# Patient Record
Sex: Female | Born: 1975 | Race: White | Hispanic: No | Smoking: Former smoker
Health system: Southern US, Community
[De-identification: ages and names within clinical notes are randomized; demographics above are authoritative.]

## PROBLEM LIST (undated history)

## (undated) DIAGNOSIS — C099 Malignant neoplasm of tonsil, unspecified: Secondary | ICD-10-CM

## (undated) HISTORY — DX: Malignant neoplasm of tonsil, unspecified: C09.9

## (undated) HISTORY — PX: TUBAL LIGATION: SHX77

---

## 2007-01-19 ENCOUNTER — Emergency Department: Payer: Self-pay | Admitting: General Practice

## 2010-07-16 ENCOUNTER — Emergency Department: Payer: Self-pay | Admitting: Emergency Medicine

## 2011-06-09 ENCOUNTER — Emergency Department: Payer: Self-pay | Admitting: Emergency Medicine

## 2012-11-26 ENCOUNTER — Ambulatory Visit: Payer: Self-pay | Admitting: Oncology

## 2012-11-29 ENCOUNTER — Ambulatory Visit: Payer: Self-pay | Admitting: Oncology

## 2012-12-08 ENCOUNTER — Encounter: Payer: Self-pay | Admitting: Oncology

## 2012-12-13 LAB — COMPREHENSIVE METABOLIC PANEL
Albumin: 3.7 g/dL (ref 3.4–5.0)
Alkaline Phosphatase: 64 U/L (ref 50–136)
Bilirubin,Total: 0.3 mg/dL (ref 0.2–1.0)
Calcium, Total: 9 mg/dL (ref 8.5–10.1)
Chloride: 104 mmol/L (ref 98–107)
Co2: 28 mmol/L (ref 21–32)
EGFR (African American): >60
EGFR (Non-African Amer.): >60
Potassium: 3.8 mmol/L (ref 3.5–5.1)
SGOT(AST): 14 U/L — ABNORMAL LOW (ref 15–37)
SGPT (ALT): 23 U/L (ref 12–78)
Sodium: 141 mmol/L (ref 136–145)
Total Protein: 7.4 g/dL (ref 6.4–8.2)

## 2012-12-13 LAB — CBC CANCER CENTER
Basophil %: 0.6 %
Eosinophil %: 1.6 %
HGB: 14.2 g/dL (ref 12.0–16.0)
Lymphocyte #: 2.7 x10 3/mm (ref 1.0–3.6)
Lymphocyte %: 26.2 %
MCH: 30.9 pg (ref 26.0–34.0)
MCV: 89 fL (ref 80–100)
Monocyte %: 6.7 %
Neutrophil #: 6.6 x10 3/mm — ABNORMAL HIGH (ref 1.4–6.5)
Neutrophil %: 64.9 %
RDW: 12.7 % (ref 11.5–14.5)

## 2012-12-20 LAB — COMPREHENSIVE METABOLIC PANEL
Anion Gap: 7 (ref 7–16)
BUN: 11 mg/dL (ref 7–18)
Bilirubin,Total: 0.2 mg/dL (ref 0.2–1.0)
Chloride: 105 mmol/L (ref 98–107)
Creatinine: 0.66 mg/dL (ref 0.60–1.30)
EGFR (African American): >60
EGFR (Non-African Amer.): >60
Glucose: 86 mg/dL (ref 65–99)
Osmolality: 280 (ref 275–301)
Potassium: 3.9 mmol/L (ref 3.5–5.1)
SGOT(AST): 10 U/L — ABNORMAL LOW (ref 15–37)
SGPT (ALT): 22 U/L (ref 12–78)
Sodium: 141 mmol/L (ref 136–145)

## 2012-12-20 LAB — CBC CANCER CENTER
Basophil #: 0.1 x10 3/mm (ref 0.0–0.1)
Eosinophil #: 0.1 x10 3/mm (ref 0.0–0.7)
Eosinophil %: 1.1 %
HCT: 40.8 % (ref 35.0–47.0)
HGB: 13.7 g/dL (ref 12.0–16.0)
Lymphocyte #: 2.5 x10 3/mm (ref 1.0–3.6)
Lymphocyte %: 22 %
MCH: 30 pg (ref 26.0–34.0)
MCHC: 33.5 g/dL (ref 32.0–36.0)
MCV: 89 fL (ref 80–100)
Monocyte #: 0.5 x10 3/mm (ref 0.2–0.9)
Monocyte %: 4.8 %
Neutrophil #: 8.1 x10 3/mm — ABNORMAL HIGH (ref 1.4–6.5)
Platelet: 403 x10 3/mm (ref 150–440)
WBC: 11.4 x10 3/mm — ABNORMAL HIGH (ref 3.6–11.0)

## 2012-12-25 ENCOUNTER — Ambulatory Visit: Payer: Self-pay | Admitting: Oncology

## 2012-12-25 ENCOUNTER — Encounter: Payer: Self-pay | Admitting: Oncology

## 2012-12-27 LAB — CBC CANCER CENTER
Basophil #: 0 x10 3/mm (ref 0.0–0.1)
Eosinophil %: 1.5 %
HGB: 13.2 g/dL (ref 12.0–16.0)
Lymphocyte #: 1.7 x10 3/mm (ref 1.0–3.6)
Lymphocyte %: 18.1 %
MCH: 30.6 pg (ref 26.0–34.0)
Monocyte #: 0.5 x10 3/mm (ref 0.2–0.9)
Monocyte %: 5.3 %
Neutrophil %: 74.6 %
RBC: 4.3 10*6/uL (ref 3.80–5.20)
RDW: 12.8 % (ref 11.5–14.5)
WBC: 9.3 x10 3/mm (ref 3.6–11.0)

## 2012-12-27 LAB — COMPREHENSIVE METABOLIC PANEL
Albumin: 3.2 g/dL — ABNORMAL LOW (ref 3.4–5.0)
Anion Gap: 9 (ref 7–16)
BUN: 9 mg/dL (ref 7–18)
Bilirubin,Total: 0.4 mg/dL (ref 0.2–1.0)
Calcium, Total: 8.5 mg/dL (ref 8.5–10.1)
Chloride: 103 mmol/L (ref 98–107)
Co2: 28 mmol/L (ref 21–32)
Creatinine: 0.73 mg/dL (ref 0.60–1.30)
EGFR (Non-African Amer.): >60
Osmolality: 278 (ref 275–301)
Potassium: 3.8 mmol/L (ref 3.5–5.1)
SGOT(AST): 16 U/L (ref 15–37)
SGPT (ALT): 31 U/L (ref 12–78)
Sodium: 140 mmol/L (ref 136–145)
Total Protein: 6.6 g/dL (ref 6.4–8.2)

## 2013-01-03 LAB — CBC CANCER CENTER
Basophil %: 0.6 %
Eosinophil #: 0.1 x10 3/mm (ref 0.0–0.7)
HCT: 39.1 % (ref 35.0–47.0)
Lymphocyte #: 1.4 x10 3/mm (ref 1.0–3.6)
Lymphocyte %: 13.8 %
MCV: 91 fL (ref 80–100)
Monocyte %: 4 %
Neutrophil #: 8.1 x10 3/mm — ABNORMAL HIGH (ref 1.4–6.5)
Neutrophil %: 80.3 %
RDW: 13.5 % (ref 11.5–14.5)
WBC: 10.1 x10 3/mm (ref 3.6–11.0)

## 2013-01-03 LAB — COMPREHENSIVE METABOLIC PANEL
Albumin: 3.3 g/dL — ABNORMAL LOW (ref 3.4–5.0)
Alkaline Phosphatase: 56 U/L (ref 50–136)
Anion Gap: 5 — ABNORMAL LOW (ref 7–16)
BUN: 7 mg/dL (ref 7–18)
Bilirubin,Total: 0.4 mg/dL (ref 0.2–1.0)
Calcium, Total: 8.6 mg/dL (ref 8.5–10.1)
Co2: 31 mmol/L (ref 21–32)
EGFR (African American): >60
Glucose: 117 mg/dL — ABNORMAL HIGH (ref 65–99)
Potassium: 3.9 mmol/L (ref 3.5–5.1)
SGPT (ALT): 33 U/L (ref 12–78)
Sodium: 139 mmol/L (ref 136–145)

## 2013-01-10 LAB — COMPREHENSIVE METABOLIC PANEL
Albumin: 3.5 g/dL (ref 3.4–5.0)
Alkaline Phosphatase: 46 U/L — ABNORMAL LOW (ref 50–136)
Anion Gap: 11 (ref 7–16)
Bilirubin,Total: 0.3 mg/dL (ref 0.2–1.0)
Chloride: 101 mmol/L (ref 98–107)
Creatinine: 0.76 mg/dL (ref 0.60–1.30)
EGFR (African American): >60
EGFR (Non-African Amer.): >60
Glucose: 79 mg/dL (ref 65–99)
Osmolality: 277 (ref 275–301)
Potassium: 4 mmol/L (ref 3.5–5.1)
SGOT(AST): 17 U/L (ref 15–37)
SGPT (ALT): 29 U/L (ref 12–78)
Sodium: 140 mmol/L (ref 136–145)
Total Protein: 6.8 g/dL (ref 6.4–8.2)

## 2013-01-10 LAB — CBC CANCER CENTER
Basophil %: 1 %
Eosinophil #: 0.1 x10 3/mm (ref 0.0–0.7)
Lymphocyte #: 1.3 x10 3/mm (ref 1.0–3.6)
MCHC: 34.7 g/dL (ref 32.0–36.0)
MCV: 90 fL (ref 80–100)
Monocyte #: 0.6 x10 3/mm (ref 0.2–0.9)
Monocyte %: 6.8 %
Platelet: 275 x10 3/mm (ref 150–440)
RBC: 4.13 10*6/uL (ref 3.80–5.20)
WBC: 8.9 x10 3/mm (ref 3.6–11.0)

## 2013-01-17 LAB — COMPREHENSIVE METABOLIC PANEL
Albumin: 3.7 g/dL (ref 3.4–5.0)
Anion Gap: 2 — ABNORMAL LOW (ref 7–16)
Bilirubin,Total: 0.4 mg/dL (ref 0.2–1.0)
Calcium, Total: 8.9 mg/dL (ref 8.5–10.1)
Chloride: 109 mmol/L — ABNORMAL HIGH (ref 98–107)
Co2: 28 mmol/L (ref 21–32)
Creatinine: 0.77 mg/dL (ref 0.60–1.30)
EGFR (Non-African Amer.): >60
Potassium: 4.2 mmol/L (ref 3.5–5.1)
SGOT(AST): 44 U/L — ABNORMAL HIGH (ref 15–37)
SGPT (ALT): 77 U/L (ref 12–78)
Sodium: 139 mmol/L (ref 136–145)

## 2013-01-17 LAB — CBC CANCER CENTER
Basophil %: 0.5 %
HCT: 39.1 % (ref 35.0–47.0)
Lymphocyte #: 1 x10 3/mm (ref 1.0–3.6)
Lymphocyte %: 16.3 %
MCHC: 34.4 g/dL (ref 32.0–36.0)
MCV: 91 fL (ref 80–100)
Monocyte #: 0.5 x10 3/mm (ref 0.2–0.9)
Neutrophil #: 4.6 x10 3/mm (ref 1.4–6.5)
Platelet: 249 x10 3/mm (ref 150–440)
RBC: 4.28 10*6/uL (ref 3.80–5.20)
RDW: 14.5 % (ref 11.5–14.5)

## 2013-01-22 ENCOUNTER — Ambulatory Visit: Payer: Self-pay | Admitting: Oncology

## 2013-01-24 LAB — CBC CANCER CENTER
Basophil #: 0 x10 3/mm (ref 0.0–0.1)
Basophil %: 0.5 %
Eosinophil #: 0.1 x10 3/mm (ref 0.0–0.7)
HCT: 40.6 % (ref 35.0–47.0)
Lymphocyte #: 1.2 x10 3/mm (ref 1.0–3.6)
Lymphocyte %: 15 %
MCH: 31.2 pg (ref 26.0–34.0)
MCV: 91 fL (ref 80–100)
Neutrophil #: 6.4 x10 3/mm (ref 1.4–6.5)
RDW: 15.1 % — ABNORMAL HIGH (ref 11.5–14.5)

## 2013-01-24 LAB — COMPREHENSIVE METABOLIC PANEL
Anion Gap: 12 (ref 7–16)
Bilirubin,Total: 0.6 mg/dL (ref 0.2–1.0)
Calcium, Total: 9 mg/dL (ref 8.5–10.1)
Chloride: 100 mmol/L (ref 98–107)
Co2: 26 mmol/L (ref 21–32)
EGFR (African American): >60
EGFR (Non-African Amer.): >60
Glucose: 100 mg/dL — ABNORMAL HIGH (ref 65–99)
Osmolality: 274 (ref 275–301)
Potassium: 4.1 mmol/L (ref 3.5–5.1)
SGOT(AST): 50 U/L — ABNORMAL HIGH (ref 15–37)
Sodium: 138 mmol/L (ref 136–145)
Total Protein: 7.4 g/dL (ref 6.4–8.2)

## 2013-01-31 LAB — CBC CANCER CENTER
Basophil #: 0 x10 3/mm (ref 0.0–0.1)
HCT: 40.7 % (ref 35.0–47.0)
Lymphocyte #: 1 x10 3/mm (ref 1.0–3.6)
MCV: 92 fL (ref 80–100)
Monocyte #: 0.4 x10 3/mm (ref 0.2–0.9)
Monocyte %: 7.7 %
Neutrophil %: 71.8 %
Platelet: 218 x10 3/mm (ref 150–440)
RBC: 4.43 10*6/uL (ref 3.80–5.20)
WBC: 5.2 x10 3/mm (ref 3.6–11.0)

## 2013-01-31 LAB — COMPREHENSIVE METABOLIC PANEL
Anion Gap: 11 (ref 7–16)
BUN: 7 mg/dL (ref 7–18)
Calcium, Total: 9.1 mg/dL (ref 8.5–10.1)
Chloride: 101 mmol/L (ref 98–107)
EGFR (African American): >60
EGFR (Non-African Amer.): >60
Glucose: 91 mg/dL (ref 65–99)
Osmolality: 275 (ref 275–301)
Potassium: 3.9 mmol/L (ref 3.5–5.1)
Sodium: 139 mmol/L (ref 136–145)
Total Protein: 7.1 g/dL (ref 6.4–8.2)

## 2013-02-04 ENCOUNTER — Emergency Department: Payer: Self-pay | Admitting: Emergency Medicine

## 2013-02-05 LAB — COMPREHENSIVE METABOLIC PANEL
Albumin: 3.8 g/dL (ref 3.4–5.0)
Alkaline Phosphatase: 51 U/L (ref 50–136)
Calcium, Total: 9.3 mg/dL (ref 8.5–10.1)
Chloride: 100 mmol/L (ref 98–107)
Creatinine: 0.74 mg/dL (ref 0.60–1.30)
EGFR (African American): >60
Glucose: 77 mg/dL (ref 65–99)
Potassium: 3.5 mmol/L (ref 3.5–5.1)
SGOT(AST): 21 U/L (ref 15–37)
SGPT (ALT): 43 U/L (ref 12–78)
Sodium: 137 mmol/L (ref 136–145)

## 2013-02-05 LAB — CBC
HGB: 14 g/dL (ref 12.0–16.0)
MCH: 31.9 pg (ref 26.0–34.0)
MCHC: 34.6 g/dL (ref 32.0–36.0)
MCV: 92 fL (ref 80–100)
RBC: 4.39 10*6/uL (ref 3.80–5.20)
WBC: 6.7 10*3/uL (ref 3.6–11.0)

## 2013-02-07 LAB — CBC CANCER CENTER
Basophil #: 0 x10 3/mm (ref 0.0–0.1)
Basophil %: 0.6 %
Eosinophil #: 0 x10 3/mm (ref 0.0–0.7)
HGB: 14.4 g/dL (ref 12.0–16.0)
Lymphocyte #: 0.9 x10 3/mm — ABNORMAL LOW (ref 1.0–3.6)
Lymphocyte %: 15.9 %
MCH: 31.9 pg (ref 26.0–34.0)
MCHC: 34.5 g/dL (ref 32.0–36.0)
Monocyte #: 0.4 x10 3/mm (ref 0.2–0.9)
Monocyte %: 6.9 %
Neutrophil %: 75.8 %
RBC: 4.5 10*6/uL (ref 3.80–5.20)
RDW: 15.7 % — ABNORMAL HIGH (ref 11.5–14.5)
WBC: 6 x10 3/mm (ref 3.6–11.0)

## 2013-02-07 LAB — COMPREHENSIVE METABOLIC PANEL
Albumin: 3.8 g/dL (ref 3.4–5.0)
Alkaline Phosphatase: 56 U/L (ref 50–136)
BUN: 10 mg/dL (ref 7–18)
Bilirubin,Total: 0.7 mg/dL (ref 0.2–1.0)
Calcium, Total: 9 mg/dL (ref 8.5–10.1)
Chloride: 101 mmol/L (ref 98–107)
Co2: 19 mmol/L — ABNORMAL LOW (ref 21–32)
EGFR (Non-African Amer.): >60
Osmolality: 272 (ref 275–301)
SGPT (ALT): 39 U/L (ref 12–78)
Sodium: 137 mmol/L (ref 136–145)
Total Protein: 7.2 g/dL (ref 6.4–8.2)

## 2013-02-22 ENCOUNTER — Ambulatory Visit: Payer: Self-pay | Admitting: Oncology

## 2013-03-24 ENCOUNTER — Ambulatory Visit: Payer: Self-pay | Admitting: Oncology

## 2013-05-02 ENCOUNTER — Ambulatory Visit: Payer: Self-pay | Admitting: Oncology

## 2013-05-24 ENCOUNTER — Ambulatory Visit: Payer: Self-pay | Admitting: Oncology

## 2013-06-06 ENCOUNTER — Ambulatory Visit: Payer: Self-pay | Admitting: Radiation Oncology

## 2013-06-06 LAB — COMPREHENSIVE METABOLIC PANEL
Albumin: 3.3 g/dL — ABNORMAL LOW (ref 3.4–5.0)
Alkaline Phosphatase: 66 U/L (ref 50–136)
Anion Gap: 8 (ref 7–16)
BUN: 12 mg/dL (ref 7–18)
Bilirubin,Total: 0.3 mg/dL (ref 0.2–1.0)
Calcium, Total: 8.9 mg/dL (ref 8.5–10.1)
Chloride: 105 mmol/L (ref 98–107)
EGFR (African American): >60
EGFR (Non-African Amer.): >60
Glucose: 89 mg/dL (ref 65–99)
Osmolality: 280 (ref 275–301)
Potassium: 3.2 mmol/L — ABNORMAL LOW (ref 3.5–5.1)
SGOT(AST): 9 U/L — ABNORMAL LOW (ref 15–37)
SGPT (ALT): 20 U/L (ref 12–78)
Sodium: 141 mmol/L (ref 136–145)
Total Protein: 6.6 g/dL (ref 6.4–8.2)

## 2013-06-06 LAB — CBC CANCER CENTER
Basophil %: 0.1 %
Eosinophil #: 0 x10 3/mm (ref 0.0–0.7)
HCT: 40.3 % (ref 35.0–47.0)
HGB: 14 g/dL (ref 12.0–16.0)
MCH: 33.8 pg (ref 26.0–34.0)
MCHC: 34.7 g/dL (ref 32.0–36.0)
Neutrophil #: 10.8 x10 3/mm — ABNORMAL HIGH (ref 1.4–6.5)
Neutrophil %: 86.2 %
Platelet: 299 x10 3/mm (ref 150–440)
RBC: 4.14 10*6/uL (ref 3.80–5.20)

## 2013-06-24 ENCOUNTER — Ambulatory Visit: Payer: Self-pay | Admitting: Oncology

## 2013-09-13 ENCOUNTER — Ambulatory Visit: Payer: Self-pay | Admitting: Oncology

## 2013-09-13 LAB — CBC CANCER CENTER
Basophil %: 0.8 %
Eosinophil %: 1.6 %
HCT: 39.5 % (ref 35.0–47.0)
Lymphocyte %: 17.7 %
MCH: 32.4 pg (ref 26.0–34.0)
MCHC: 34.6 g/dL (ref 32.0–36.0)
Monocyte #: 0.3 x10 3/mm (ref 0.2–0.9)
Neutrophil #: 5.3 x10 3/mm (ref 1.4–6.5)
RBC: 4.22 10*6/uL (ref 3.80–5.20)
RDW: 12.6 % (ref 11.5–14.5)
WBC: 7 x10 3/mm (ref 3.6–11.0)

## 2013-09-13 LAB — BASIC METABOLIC PANEL
Anion Gap: 5 — ABNORMAL LOW (ref 7–16)
BUN: 8 mg/dL (ref 7–18)
Co2: 29 mmol/L (ref 21–32)
Creatinine: 0.77 mg/dL (ref 0.60–1.30)
EGFR (African American): >60
EGFR (Non-African Amer.): >60
Glucose: 85 mg/dL (ref 65–99)
Potassium: 3.9 mmol/L (ref 3.5–5.1)
Sodium: 141 mmol/L (ref 136–145)

## 2013-09-24 ENCOUNTER — Ambulatory Visit: Payer: Self-pay | Admitting: Oncology

## 2013-09-28 ENCOUNTER — Ambulatory Visit: Payer: Self-pay | Admitting: Unknown Physician Specialty

## 2013-10-06 ENCOUNTER — Ambulatory Visit: Payer: Self-pay | Admitting: Oncology

## 2013-10-24 ENCOUNTER — Ambulatory Visit: Payer: Self-pay | Admitting: Oncology

## 2013-11-03 LAB — CBC CANCER CENTER
Basophil %: 0.7 %
Eosinophil #: 0.1 x10 3/mm (ref 0.0–0.7)
HCT: 38.4 % (ref 35.0–47.0)
Lymphocyte %: 20 %
MCH: 31 pg (ref 26.0–34.0)
MCHC: 33.7 g/dL (ref 32.0–36.0)
MCV: 92 fL (ref 80–100)
Monocyte #: 0.3 x10 3/mm (ref 0.2–0.9)
Monocyte %: 5.8 %
Neutrophil %: 71.3 %
WBC: 4.7 x10 3/mm (ref 3.6–11.0)

## 2013-11-03 LAB — COMPREHENSIVE METABOLIC PANEL
Calcium, Total: 8.8 mg/dL (ref 8.5–10.1)
Chloride: 106 mmol/L (ref 98–107)
Co2: 29 mmol/L (ref 21–32)
EGFR (African American): >60
EGFR (Non-African Amer.): >60
Osmolality: 282 (ref 275–301)
Potassium: 3.8 mmol/L (ref 3.5–5.1)
SGPT (ALT): 15 U/L (ref 12–78)
Sodium: 142 mmol/L (ref 136–145)

## 2013-11-03 LAB — TSH: Thyroid Stimulating Horm: 6.12 u[IU]/mL — ABNORMAL HIGH

## 2013-11-07 ENCOUNTER — Ambulatory Visit: Payer: Self-pay | Admitting: Gastroenterology

## 2013-11-24 ENCOUNTER — Ambulatory Visit: Payer: Self-pay | Admitting: Oncology

## 2013-12-01 ENCOUNTER — Ambulatory Visit: Payer: Self-pay | Admitting: Oncology

## 2013-12-12 ENCOUNTER — Observation Stay: Payer: Self-pay | Admitting: Oncology

## 2013-12-12 LAB — CBC CANCER CENTER
BASOS ABS: 0 x10 3/mm (ref 0.0–0.1)
Basophil %: 0.5 %
EOS ABS: 0.1 x10 3/mm (ref 0.0–0.7)
Eosinophil %: 0.9 %
HCT: 41.7 % (ref 35.0–47.0)
HGB: 14.2 g/dL (ref 12.0–16.0)
Lymphocyte #: 1.2 x10 3/mm (ref 1.0–3.6)
Lymphocyte %: 18 %
MCH: 31 pg (ref 26.0–34.0)
MCHC: 34.1 g/dL (ref 32.0–36.0)
MCV: 91 fL (ref 80–100)
Monocyte #: 0.3 x10 3/mm (ref 0.2–0.9)
Monocyte %: 4.2 %
Neutrophil #: 4.9 x10 3/mm (ref 1.4–6.5)
Neutrophil %: 76.4 %
Platelet: 265 x10 3/mm (ref 150–440)
RBC: 4.59 10*6/uL (ref 3.80–5.20)
RDW: 13.1 % (ref 11.5–14.5)
WBC: 6.4 x10 3/mm (ref 3.6–11.0)

## 2013-12-12 LAB — URINALYSIS, COMPLETE
BILIRUBIN, UR: NEGATIVE
BLOOD: NEGATIVE
Glucose,UR: NEGATIVE mg/dL (ref 0–75)
Leukocyte Esterase: NEGATIVE
NITRITE: NEGATIVE
PROTEIN: NEGATIVE
Ph: 6 (ref 4.5–8.0)
RBC,UR: 1 /HPF (ref 0–5)
Specific Gravity: 1.027 (ref 1.003–1.030)
Squamous Epithelial: 6

## 2013-12-12 LAB — COMPREHENSIVE METABOLIC PANEL
ALBUMIN: 3.4 g/dL (ref 3.4–5.0)
ALK PHOS: 45 U/L
ALT: 13 U/L (ref 12–78)
Anion Gap: 8 (ref 7–16)
BILIRUBIN TOTAL: 0.3 mg/dL (ref 0.2–1.0)
BUN: 11 mg/dL (ref 7–18)
CALCIUM: 8.8 mg/dL (ref 8.5–10.1)
CO2: 24 mmol/L (ref 21–32)
Chloride: 107 mmol/L (ref 98–107)
Creatinine: 0.69 mg/dL (ref 0.60–1.30)
Glucose: 84 mg/dL (ref 65–99)
Osmolality: 276 (ref 275–301)
Potassium: 3.2 mmol/L — ABNORMAL LOW (ref 3.5–5.1)
SGOT(AST): 21 U/L (ref 15–37)
Sodium: 139 mmol/L (ref 136–145)
Total Protein: 6.9 g/dL (ref 6.4–8.2)

## 2013-12-12 LAB — T4, FREE: Free Thyroxine: 0.93 ng/dL (ref 0.76–1.46)

## 2013-12-12 LAB — TSH: THYROID STIMULATING HORM: 3.25 u[IU]/mL

## 2013-12-13 LAB — BASIC METABOLIC PANEL
Anion Gap: 7 (ref 7–16)
BUN: 9 mg/dL (ref 7–18)
CALCIUM: 8.6 mg/dL (ref 8.5–10.1)
CO2: 23 mmol/L (ref 21–32)
Chloride: 108 mmol/L — ABNORMAL HIGH (ref 98–107)
Creatinine: 0.75 mg/dL (ref 0.60–1.30)
GLUCOSE: 67 mg/dL (ref 65–99)
OSMOLALITY: 273 (ref 275–301)
POTASSIUM: 3.1 mmol/L — AB (ref 3.5–5.1)
SODIUM: 138 mmol/L (ref 136–145)

## 2013-12-17 LAB — CULTURE, BLOOD (SINGLE)

## 2013-12-25 ENCOUNTER — Ambulatory Visit: Payer: Self-pay | Admitting: Oncology

## 2014-01-22 ENCOUNTER — Ambulatory Visit: Payer: Self-pay | Admitting: Oncology

## 2014-02-23 ENCOUNTER — Ambulatory Visit: Payer: Self-pay | Admitting: Oncology

## 2014-02-24 LAB — BASIC METABOLIC PANEL
Anion Gap: 7 (ref 7–16)
BUN: 11 mg/dL (ref 7–18)
Calcium, Total: 9 mg/dL (ref 8.5–10.1)
Chloride: 103 mmol/L (ref 98–107)
Co2: 30 mmol/L (ref 21–32)
Creatinine: 0.8 mg/dL (ref 0.60–1.30)
GLUCOSE: 70 mg/dL (ref 65–99)
Osmolality: 277 (ref 275–301)
POTASSIUM: 3.5 mmol/L (ref 3.5–5.1)
Sodium: 140 mmol/L (ref 136–145)

## 2014-02-24 LAB — CBC CANCER CENTER
BASOS PCT: 0.7 %
Basophil #: 0.1 x10 3/mm (ref 0.0–0.1)
Eosinophil #: 0.1 x10 3/mm (ref 0.0–0.7)
Eosinophil %: 1.1 %
HCT: 38.5 % (ref 35.0–47.0)
HGB: 12.8 g/dL (ref 12.0–16.0)
Lymphocyte #: 1.6 x10 3/mm (ref 1.0–3.6)
Lymphocyte %: 21.4 %
MCH: 30.7 pg (ref 26.0–34.0)
MCHC: 33.3 g/dL (ref 32.0–36.0)
MCV: 92 fL (ref 80–100)
Monocyte #: 0.4 x10 3/mm (ref 0.2–0.9)
Monocyte %: 4.9 %
Neutrophil #: 5.3 x10 3/mm (ref 1.4–6.5)
Neutrophil %: 71.9 %
Platelet: 328 x10 3/mm (ref 150–440)
RBC: 4.17 10*6/uL (ref 3.80–5.20)
RDW: 13 % (ref 11.5–14.5)
WBC: 7.3 x10 3/mm (ref 3.6–11.0)

## 2014-02-24 LAB — MAGNESIUM: MAGNESIUM: 1.8 mg/dL

## 2014-02-24 LAB — TSH: THYROID STIMULATING HORM: 6.84 u[IU]/mL — AB

## 2014-03-24 ENCOUNTER — Ambulatory Visit: Payer: Self-pay | Admitting: Oncology

## 2014-06-02 ENCOUNTER — Ambulatory Visit: Payer: Self-pay | Admitting: Oncology

## 2014-06-02 LAB — TSH: Thyroid Stimulating Horm: 9 u[IU]/mL — ABNORMAL HIGH

## 2014-06-02 LAB — BASIC METABOLIC PANEL
Anion Gap: 6 — ABNORMAL LOW (ref 7–16)
BUN: 9 mg/dL (ref 7–18)
CALCIUM: 8.6 mg/dL (ref 8.5–10.1)
CHLORIDE: 107 mmol/L (ref 98–107)
Co2: 27 mmol/L (ref 21–32)
Creatinine: 0.72 mg/dL (ref 0.60–1.30)
EGFR (African American): >60
EGFR (Non-African Amer.): >60
Glucose: 79 mg/dL (ref 65–99)
Osmolality: 277 (ref 275–301)
Potassium: 3.8 mmol/L (ref 3.5–5.1)
SODIUM: 140 mmol/L (ref 136–145)

## 2014-06-24 ENCOUNTER — Ambulatory Visit: Payer: Self-pay | Admitting: Oncology

## 2014-10-05 ENCOUNTER — Ambulatory Visit: Payer: Self-pay | Admitting: Oncology

## 2014-12-25 ENCOUNTER — Ambulatory Visit: Payer: Self-pay | Admitting: Oncology

## 2014-12-25 LAB — BASIC METABOLIC PANEL
ANION GAP: 5 — AB (ref 7–16)
BUN: 11 mg/dL (ref 7–18)
CHLORIDE: 109 mmol/L — AB (ref 98–107)
Calcium, Total: 8.6 mg/dL (ref 8.5–10.1)
Co2: 25 mmol/L (ref 21–32)
Creatinine: 0.74 mg/dL (ref 0.60–1.30)
EGFR (Non-African Amer.): >60
Glucose: 104 mg/dL — ABNORMAL HIGH (ref 65–99)
Osmolality: 277 (ref 275–301)
Potassium: 3.9 mmol/L (ref 3.5–5.1)
Sodium: 139 mmol/L (ref 136–145)

## 2014-12-25 LAB — T4, FREE: FREE THYROXINE: 0.76 ng/dL (ref 0.76–1.46)

## 2014-12-25 LAB — TSH: Thyroid Stimulating Horm: 14.2 u[IU]/mL — ABNORMAL HIGH

## 2015-01-23 ENCOUNTER — Ambulatory Visit
Admit: 2015-01-23 | Disposition: A | Discharge status: Home / Self Care | Payer: Self-pay | Attending: Oncology | Admitting: Oncology

## 2015-03-05 ENCOUNTER — Emergency Department
Admit: 2015-03-05 | Disposition: A | Discharge status: Home / Self Care | Payer: Self-pay | Admitting: Emergency Medicine

## 2015-03-16 NOTE — Consult Note (Signed)
Reason for Visit: This 39 year old Female patient presents to the clinic for initial evaluation of  head and neck cancer .   Referred by Dr. Colin Benton [facility].  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 Initially was associated with strep throat although progressed in size. She initially had an FNA of her left tonsil which showed rare atypical squamous epithelium cannot exclude squamous cell carcinoma. She was evaluated by ENT at [facility]. She then underwent a left neck dissection with panendoscopy with left partial glossectomy, left neck dissection, left pharyngectomy, right tonsillectomy and nasopharyngoscopy. She also had a skin graft from her left shoulder. 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 She is having no head and neck pain or dysphagia at this time. Final pathology report showed poorly differentiated squamous cell carcinoma most likely arising from the left tonsil with invasion of the deep pharynx involving fibroadenosis adipose tissue and skeletal muscle. There was also perineural invasion. Right tonsil was negative for malignancy. Multiple lymph nodes were involved including a 4.8 cm node with extra nodal extension of metastatic disease. Level II and level III nodes were involved. Patient is now seen for consideration of postoperative radiation therapy and chemotherapy.  Past Hx:    Denies medical history:    Tubal  Ligation:   Past, Family and Social History:   Past Medical History positive    Past Surgical History tubal ligation    Family History noncontributory    Social History positive    Social History Comments greater than 30-pack-year smoking history has recently quit smoking.    Additional Past Medical and Surgical History patient is accompanied by today   Allergies:   Codeine: Hives, Other  Home Meds:  Home Medications: Medication Instructions Status  oxyCODONE 10 mg oral tablet 1 tab(s) orally every 6 hours as needed for pain. Active  MS Contin 15 mg/12 hr tablet, extended release 1 tab(s) orally every 8 hours x 30 days, As Needed Active   Review of Systems:   General negative    Performance Status (ECOG) 0    Skin negative    Breast negative    Ophthalmologic negative    ENMT see HPI    Respiratory and Thorax negative    Cardiovascular negative    Gastrointestinal negative    Genitourinary negative    Musculoskeletal negative    Neurological negative    Psychiatric negative    Hematology/Lymphatics negative    Endocrine negative    Allergic/Immunologic negative    Review of Systems   according to the nurse's notesPatient denies any weight loss, fatigue, weakness, fever, chills or night sweats. Patient denies any loss of vision, blurred vision. Patient denies any ringing  of the ears or hearing loss. No irregular heartbeat. Patient denies heart murmur or history of fainting. Patient denies any chest pain or pain radiating to her upper extremities. Patient denies any shortness of breath, difficulty breathing  at night, cough or hemoptysis. Patient denies any swelling in the lower legs. Patient denies any nausea vomiting, vomiting of blood, or coffee ground material in the vomitus. Patient denies any stomach pain. Patient states has had normal bowel movements no significant constipation or diarrhea. Patient denies any dysuria, hematuria or significant nocturia.  Patient denies any problems walking, swelling in the joints or loss of balance. Patient denies any skin changes, loss of hair or loss of weight. Patient denies any excessive worrying or anxiety or significant depression. Patient denies any problems with insomnia. Patient denies excessive thirst, polyuria, polydipsia. Patient denies any swollen glands, patient denies easy bruising or easy bleeding. Patient denies any recent infections, allergies or URI. Patient "s visual fields have not changed significantly in recent time.  Physical Exam:  General/Skin/HEENT:   General normal    Skin normal    Eyes normal    Additional PE well-developed female in NAD. Oral cavity shows teeth in a fair state of repair. She status post bilateral tonsillectomies. No oral mucosal lesions are identified. Indirect mirror examination shows upper airway clear vallecula and base of tongue within normal limits. There is protrusion of the tongue with deviation towards the left side. Neck incision is well-healed. No evidence of some digastric cervical or supraclavicular adenopathy is appreciated. Lungs are clear to A&P cardiac examination shows regular rate and rhythm.   Breasts/Resp/CV/GI/GU:   Respiratory and Thorax normal    Cardiovascular normal    Gastrointestinal normal    Genitourinary normal   MS/Neuro/Psych/Lymph:   Musculoskeletal normal    Neurological normal    Lymphatics normal   Assessment and Plan:  Impression:   pathologic stage IVA squamous cell carcinoma of the left tonsil status postradical head and neck surgery and 39 year old female  Plan:   at this time based on her poor prognostic features including multiple involved left neck nodes, extracapsular spread of metastatic disease to nodes, perineural invasion, and invasion of the skeletal muscles of the head and neck would offer adjuvant radiation therapy. I discussed the case personally with Dr. Grayland Ormond. He will also be offering concurrent  cisplatin-based chemotherapy in the adjuvant setting. Although the doses debatable I would like to use IMRT treatment planning and delivery to spare the oral cavity, parotid glands, spinal [id number] and deliver up to 6400 cGy to the area of primary tumor involvement.8 risks and benefits of treatment were reviewed with the patient including possibility of increased xerostomia, skin reaction, fatigue, and increased frequency of dental caries. We have ordered a PET/CT scan for determination of any other gross residual disease or head and neck region or possibility of metastatic disease elsewhere. Will review that and then start treatment planning. I've ordered a CT scan simulation in about a week and a half. Patient seems to [place] treatment plan well.  I would like to take this opportunity to thank you for allowing me to continue to participate in this patient's care.  CC Referral:   cc: Dr. Colin Benton [facility] head and neck,   Electronic Signatures: Armstead Peaks (MD)  (Signed 03-Jan-14 12:38)  Authored: HPI, Diagnosis, Past Hx, PFSH, Allergies, Home Meds, ROS, Physical Exam, Encounter Assessment and Plan, CC Referring Physician   Last Updated: 03-Jan-14 12:38 by Armstead Peaks (MD)

## 2015-03-17 NOTE — Consult Note (Signed)
Pt had EGD a month ago, mild inflammation per patient.  She needs to stay on powerful laxatives due to her pain medicine.  Pain better after bowel cleaned out.  Recommended increase calorie intake from liquids and semiliquids and to purchase a bathroom scale and weigh frequently.  See consult by Loren Racer, PA.  Electronic Signatures: Manya Silvas (MD)  (Signed on 20-Jan-15 17:21)  Authored  Last Updated: 20-Jan-15 17:21 by Manya Silvas (MD)

## 2015-03-17 NOTE — Consult Note (Signed)
PATIENT NAMEITZY, [name] MR#:  [id number] DATE OF BIRTH:  May 07, 1976  DATE OF CONSULTATION:  12/13/2013  REFERRING PHYSICIAN: Dr. Grayland [name]  CONSULTING PHYSICIAN:  Brittany Cheers, MD and Brittany Sox. Brittany Rowser, PA-C  REASON FOR CONSULTATION: Nausea and vomiting.   HISTORY OF PRESENT ILLNESS: This is a pleasant 39 year old female established with the [facility] for history of tonsillar cancer. She continues to be followed by them and does have ongoing complaints of dysphagia with unintentional weight loss that was quite profound over the past year. The patient states she has lost over 100 pounds over the past year and in more recent months it has been about 60 pounds. She admits to an extremely poor appetite with very little p.o. intake and there were some concerns that she was starting to have some failure to thrive. The nausea and vomiting had gotten real significant over the past few days, and she was admitted for further treatment. She has been obtaining IV fluids as well as IV antiemetics and overnight she has actually felt significantly better. She did tolerate a full regular breakfast this morning and did request lunch that she is currently waiting on. She is denying any current nausea or vomiting. She has had some pretty severe constipation recently and did need to be manually disimpacted last night. She also had another large bowel movement to follow and does feel that this was a large contributor as to why she is feeling better today. She denies any black or bloody stools. No fever or chills. No chest pain or shortness of breath. She denies any heartburn or indigestion, but does feel ongoing dysphagia. Of note, recent PET scan, CT of the neck and EGD were overall unrevealing in identifying and etiology to her nausea, vomiting, dysphagia, and poor p.o. intake. EGD was performed by Dr. Allen [name] on November 07, 2013 and it was notable for some gastritis, but was otherwise unrevealing and did show a normal  esophagus.   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   PAST SURGICAL HISTORY: Tubal ligation and surgery for tonsillar cancer.   FAMILY HISTORY: No known family history of GI malignancy, colon polyps or IBD.   SOCIAL HISTORY: The patient does report tobacco use. No alcohol or illicit drug use.   ALLERGIES: CODEINE.   REVIEW OF SYSTEMS: A 10 system review of systems was obtained on the patient. Pertinent positives are mentioned above and otherwise negative.   OBJECTIVE:  VITAL SIGNS: Blood pressure 106/64, heart rate 58, respirations 20, temperature 97.6, bedside pulse oximetry 98% on room air.  GENERAL: This is a pleasant 39 year old female resting quietly and comfortably in bed in no acute distress. Alert and oriented x 3.  HEAD: Atraumatic, normocephalic.  NECK: Supple. No lymphadenopathy noted.  HEENT: Sclerae anicteric. Mucous membranes moist.  LUNGS: Respirations are even and unlabored. Clear to auscultation bilateral anterior lung fields.  CARDIAC: Regular rate and rhythm. S1 and S2 noted.  ABDOMEN: Soft, nontender, nondistended. Normoactive bowel sounds noted in all 4 quadrants and perhaps somewhat hyperactive, though the patient has been given medications to help her bowels move, which may be the reason they are hyperactive. No signs of an acute abdomen. No masses, hernias or organomegaly appreciated.  RECTAL: Deferred.  PSYCHIATRIC: Appropriate mood and affect.  EXTREMITIES: Negative for lower extremity edema, 2+ pulses noted bilateral upper extremities.   LABORATORY DATA: Sodium 138, potassium 3.1, BUN 9, creatinine 0.75, glucose 67.   OTHER DATA: Recent PET scan, CT  of the neck and EGD were overall negative. EGD was performed by Dr. Allen [name] on November 07, 2013 notable for gastritis and duodenitis, but did reveal a normal esophagus.   ASSESSMENT:  1.  Nausea and vomiting.  2.  History of stage IV squamous cell carcinoma of the right tonsil  established with the [place]. Recent PET scan and CT of the neck were negative.  3.  Ongoing dysphagia and throat discomfort.  4.  Constipation.  5.  Failure to thrive with recent weight loss over the past year.   PLAN: I have discussed this patient's case in detail with Dr. Gaylyn [name]. who is involved in the development of the patient's plan of care. The patient is symptomatically improved today and did tolerate a full solid regular breakfast this morning and is anticipating lunch in just a little bit and states that her nausea and vomiting have entirely resolved. Certainly a large contributor to her poor p.o. intake and nausea could be severe constipation. She was manually disimpacted last night and has felt significantly better since that time. Therefore, we do a stress the importance of getting her on a regular bowel regimen, following a high fiber diet, getting adequate hydration and certainly using laxatives or stool softeners as necessary to avoid getting a fecal impaction. An additional etiology is that she is on a fentanyl patch and oxycodone on a fairly regular basis and these certainly have side effects of constipation as well. Therefore, continue symptomatic management with IV fluids and antiemetics as needed. Diet as tolerated. We will continue to monitor this patient and make further recommendations per clinical course. All questions were answered.   Thank you so much for this consultation and for allowing [place] to participate in the patient's plan of care.  ____________________________ Brittany Sox. Brittany Tadros, PA-C kme:aw D: 12/13/2013 13:22:25 ET T: 12/13/2013 14:00:32 ET JOB#: [id number]  cc: Brittany Sox. Brittany Bala, PA-C, <Dictator> [place] PA ELECTRONICALLY SIGNED 12/13/2013 14:28

## 2015-06-26 ENCOUNTER — Other Ambulatory Visit: Payer: Self-pay | Admitting: *Deleted

## 2015-06-26 DIAGNOSIS — C099 Malignant neoplasm of tonsil, unspecified: Secondary | ICD-10-CM

## 2015-06-26 DIAGNOSIS — C760 Malignant neoplasm of head, face and neck: Secondary | ICD-10-CM

## 2015-06-26 DIAGNOSIS — Z923 Personal history of irradiation: Secondary | ICD-10-CM

## 2015-06-27 ENCOUNTER — Inpatient Hospital Stay: Payer: Medicare Other | Admitting: Oncology

## 2015-06-27 ENCOUNTER — Inpatient Hospital Stay: Payer: Medicare Other | Attending: Oncology

## 2015-07-25 ENCOUNTER — Inpatient Hospital Stay: Payer: Medicare Other

## 2015-07-25 ENCOUNTER — Inpatient Hospital Stay: Payer: Medicare Other | Admitting: Oncology

## 2015-08-14 ENCOUNTER — Ambulatory Visit: Payer: Medicare Other | Admitting: Oncology

## 2015-08-14 ENCOUNTER — Other Ambulatory Visit: Payer: Medicare Other

## 2015-08-28 ENCOUNTER — Inpatient Hospital Stay: Payer: Medicare Other | Attending: Oncology

## 2015-08-28 ENCOUNTER — Encounter: Payer: Self-pay | Admitting: Oncology

## 2015-08-28 ENCOUNTER — Inpatient Hospital Stay (HOSPITAL_BASED_OUTPATIENT_CLINIC_OR_DEPARTMENT_OTHER): Payer: Medicare Other | Admitting: Oncology

## 2015-08-28 VITALS — Wt 197.1 lb

## 2015-08-28 DIAGNOSIS — F419 Anxiety disorder, unspecified: Secondary | ICD-10-CM | POA: Insufficient documentation

## 2015-08-28 DIAGNOSIS — F17210 Nicotine dependence, cigarettes, uncomplicated: Secondary | ICD-10-CM | POA: Insufficient documentation

## 2015-08-28 DIAGNOSIS — Z79899 Other long term (current) drug therapy: Secondary | ICD-10-CM

## 2015-08-28 DIAGNOSIS — C099 Malignant neoplasm of tonsil, unspecified: Secondary | ICD-10-CM

## 2015-08-28 DIAGNOSIS — Z923 Personal history of irradiation: Secondary | ICD-10-CM

## 2015-08-28 LAB — BASIC METABOLIC PANEL
ANION GAP: 3 — AB (ref 5–15)
BUN: 12 mg/dL (ref 6–20)
CO2: 26 mmol/L (ref 22–32)
CREATININE: 0.75 mg/dL (ref 0.44–1.00)
Calcium: 7.9 mg/dL — ABNORMAL LOW (ref 8.9–10.3)
Chloride: 107 mmol/L (ref 101–111)
GFR calc non Af Amer: >60 mL/min (ref >60)
Glucose, Bld: 101 mg/dL — ABNORMAL HIGH (ref 65–99)
Potassium: 3.7 mmol/L (ref 3.5–5.1)
SODIUM: 136 mmol/L (ref 135–145)

## 2015-08-28 LAB — T4, FREE: Free T4: 0.67 ng/dL (ref 0.61–1.12)

## 2015-08-28 NOTE — Progress Notes (Signed)
Patient still has pain with swallowing and is being treated by pain clinic.

## 2015-08-29 LAB — THYROID PANEL WITH TSH
FREE THYROXINE INDEX: 1.6 (ref 1.2–4.9)
T3 Uptake Ratio: 24 % (ref 24–39)
T4, Total: 6.5 ug/dL (ref 4.5–12.0)
TSH: 7.84 u[IU]/mL — ABNORMAL HIGH (ref 0.450–4.500)

## 2015-09-13 DIAGNOSIS — C099 Malignant neoplasm of tonsil, unspecified: Secondary | ICD-10-CM | POA: Insufficient documentation

## 2015-09-13 DIAGNOSIS — C140 Malignant neoplasm of pharynx, unspecified: Secondary | ICD-10-CM | POA: Diagnosis not present

## 2015-09-13 DIAGNOSIS — G893 Neoplasm related pain (acute) (chronic): Secondary | ICD-10-CM | POA: Diagnosis not present

## 2015-09-13 DIAGNOSIS — Z79891 Long term (current) use of opiate analgesic: Secondary | ICD-10-CM | POA: Diagnosis not present

## 2015-09-13 NOTE — Progress Notes (Signed)
[place]  Telephone:([phone]) [phone] [phone]) [phone]  ID: Brittany [name] OB: March 01, 1976  MR#: [id number]  DTO#:[id number]  Patient Care Team: Lorelee Market, MD as PCP - General (Family Medicine)  CHIEF COMPLAINT:  Chief Complaint  Patient presents with  . tonsil cancer    INTERVAL HISTORY: Patient returns to clinic today for routine 6 month evaluation.  She continues to have pain with swallowing, but this is better controlled after treatment in the pain clinic. She has a good appetite and denies any further weight loss.  She has no neurologic complaints. She denies any fevers. She denies any chest pain or shortness of breath.  She denies any nausea, vomiting, constipation, diarrhea.  She has no urinary complaints.  Patient offers no further specific complaints today.   REVIEW OF SYSTEMS:   Review of Systems  Constitutional: Negative for fever, weight loss and malaise/fatigue.  HENT:       Dysphasia.  Cardiovascular: Negative.   Gastrointestinal: Negative.   Musculoskeletal: Negative.   Neurological: Negative.  Negative for weakness.    As per HPI. Otherwise, a complete review of systems is negatve.  PAST MEDICAL HISTORY: Past Medical History  Diagnosis Date  . Tonsil cancer ([place])     PAST SURGICAL HISTORY: Past Surgical History  Procedure Laterality Date  . Tubal ligation      FAMILY HISTORY: Reviewed and unchanged. No reported history of malignancy or chronic disease.     ADVANCED DIRECTIVES:    HEALTH MAINTENANCE: Social History  Substance Use Topics  . Smoking status: Current Every Day Smoker  . Smokeless tobacco: Never Used  . Alcohol Use: No     Colonoscopy:  PAP:  Bone density:  Lipid panel:  Allergies  Allergen Reactions  . Codeine Hives    Current Outpatient Prescriptions  Medication Sig Dispense Refill  . gabapentin (NEURONTIN) 300 MG capsule     . Oxycodone HCl 10 MG TABS TAKE 1 TABLET BY MOUTH EVERY 6 TO 8HOURS  AND 1 AT BEDTIME AS NEEDED  0   No current facility-administered medications for this visit.    OBJECTIVE: There were no vitals filed for this visit.   There is no height on file to calculate BMI.    ECOG FS:0 - Asymptomatic  General: Well-developed, well-nourished, no acute distress. Eyes: Pink conjunctiva, anicteric sclera. HEENT: Normocephalic, moist mucous membranes, clear oropharnyx. No palpable lymphadenopathy. Lungs: Clear to auscultation bilaterally. Heart: Regular rate and rhythm. No rubs, murmurs, or gallops. Abdomen: Soft, nontender, nondistended. No organomegaly noted, normoactive bowel sounds. Musculoskeletal: No edema, cyanosis, or clubbing. Neuro: Alert, answering all questions appropriately. Cranial nerves grossly intact. Skin: No rashes or petechiae noted. Psych: Normal affect.   LAB RESULTS:  Lab Results  Component Value Date   NA 136 08/28/2015   K 3.7 08/28/2015   CL 107 08/28/2015   CO2 26 08/28/2015   GLUCOSE 101* 08/28/2015   BUN 12 08/28/2015   CREATININE 0.75 08/28/2015   CALCIUM 7.9* 08/28/2015   PROT 6.9 12/12/2013   ALBUMIN 3.4 12/12/2013   AST 21 12/12/2013   ALT 13 12/12/2013   ALKPHOS 45 12/12/2013   BILITOT 0.3 12/12/2013   GFRNONAA >60 08/28/2015   GFRAA >60 08/28/2015    Lab Results  Component Value Date   WBC 7.3 02/24/2014   NEUTROABS 5.3 02/24/2014   HGB 12.8 02/24/2014   HCT 38.5 02/24/2014   MCV 92 02/24/2014   PLT 328 02/24/2014     STUDIES: No results found.  ASSESSMENT: Stage IVa tonsillar squamous cell carcinoma  PLAN:    1.  Head and neck cancer: Patient completed concurrent chemotherapy with cisplatin and XRT in March 2014.  CT results completed on January 01, 2015 were reviewed independently and are essentially unchanged.  No further imaging is necessary at this time unless there is suspicion of recurrence. Patient has not been evaluated by ENT in several years despite recommendations. Thyroid studies are  essentially within normal limits. Return to clinic in 6 months for routine evaluation. 2.  Pain: Continue current treatment per pain clinic. 3.  Anxiety: Continue Xanax as needed.   Patient expressed understanding and was in agreement with this plan. She also understands that She can call clinic at any time with any questions, concerns, or complaints.   Tonsil cancer [facility])   Staging form: Pharynx - Oropharynx, AJCC 7th Edition     Clinical stage from 09/13/2015: Stage IVA (T4a, N2, M0) - Signed by Lloyd Huger, MD on 09/13/2015   Lloyd Huger, MD   09/13/2015 11:49 PM

## 2015-10-29 ENCOUNTER — Emergency Department: Payer: Medicare Other

## 2015-10-29 ENCOUNTER — Emergency Department
Admission: EM | Admit: 2015-10-29 | Discharge: 2015-10-29 | Disposition: A | Discharge status: Home / Self Care | Payer: Medicare Other | Attending: Emergency Medicine | Admitting: Emergency Medicine

## 2015-10-29 ENCOUNTER — Encounter: Payer: Self-pay | Admitting: Emergency Medicine

## 2015-10-29 DIAGNOSIS — M545 Low back pain, unspecified: Secondary | ICD-10-CM

## 2015-10-29 DIAGNOSIS — Z3202 Encounter for pregnancy test, result negative: Secondary | ICD-10-CM | POA: Insufficient documentation

## 2015-10-29 DIAGNOSIS — Z79899 Other long term (current) drug therapy: Secondary | ICD-10-CM | POA: Diagnosis not present

## 2015-10-29 DIAGNOSIS — R109 Unspecified abdominal pain: Secondary | ICD-10-CM | POA: Insufficient documentation

## 2015-10-29 DIAGNOSIS — Z87891 Personal history of nicotine dependence: Secondary | ICD-10-CM | POA: Diagnosis not present

## 2015-10-29 DIAGNOSIS — M5442 Lumbago with sciatica, left side: Secondary | ICD-10-CM | POA: Diagnosis not present

## 2015-10-29 LAB — URINALYSIS COMPLETE WITH MICROSCOPIC (ARMC ONLY)
BACTERIA UA: NONE SEEN
BILIRUBIN URINE: NEGATIVE
GLUCOSE, UA: NEGATIVE mg/dL
Ketones, ur: NEGATIVE mg/dL
LEUKOCYTES UA: NEGATIVE
NITRITE: NEGATIVE
Protein, ur: NEGATIVE mg/dL
SPECIFIC GRAVITY, URINE: 1.024 (ref 1.005–1.030)
pH: 6 (ref 5.0–8.0)

## 2015-10-29 LAB — POCT PREGNANCY, URINE: PREG TEST UR: NEGATIVE

## 2015-10-29 LAB — CBC
HEMATOCRIT: 38.3 % (ref 35.0–47.0)
Hemoglobin: 12.9 g/dL (ref 12.0–16.0)
MCH: 30.2 pg (ref 26.0–34.0)
MCHC: 33.6 g/dL (ref 32.0–36.0)
MCV: 89.8 fL (ref 80.0–100.0)
Platelets: 323 10*3/uL (ref 150–440)
RBC: 4.26 MIL/uL (ref 3.80–5.20)
RDW: 13.2 % (ref 11.5–14.5)
WBC: 7 10*3/uL (ref 3.6–11.0)

## 2015-10-29 LAB — COMPREHENSIVE METABOLIC PANEL
ALBUMIN: 3.8 g/dL (ref 3.5–5.0)
ALK PHOS: 48 U/L (ref 38–126)
ALT: 14 U/L (ref 14–54)
AST: 16 U/L (ref 15–41)
Anion gap: 4 — ABNORMAL LOW (ref 5–15)
BUN: 10 mg/dL (ref 6–20)
CALCIUM: 8.7 mg/dL — AB (ref 8.9–10.3)
CO2: 28 mmol/L (ref 22–32)
CREATININE: 0.69 mg/dL (ref 0.44–1.00)
Chloride: 107 mmol/L (ref 101–111)
GFR calc Af Amer: >60 mL/min (ref >60)
GFR calc non Af Amer: >60 mL/min (ref >60)
GLUCOSE: 90 mg/dL (ref 65–99)
Potassium: 3.9 mmol/L (ref 3.5–5.1)
Sodium: 139 mmol/L (ref 135–145)
Total Bilirubin: <0.1 mg/dL — ABNORMAL LOW (ref 0.3–1.2)
Total Protein: 6.6 g/dL (ref 6.5–8.1)

## 2015-10-29 MED ORDER — IBUPROFEN 200 MG PO TABS: 600.0000 mg | ORAL_TABLET | Freq: Four times a day (QID) | ORAL | Status: AC | PRN

## 2015-10-29 MED ORDER — KETOROLAC TROMETHAMINE 30 MG/ML IJ SOLN
INTRAMUSCULAR | Status: AC
  Administered 2015-10-29: 30 mg via INTRAMUSCULAR
  Filled 2015-10-29: qty 1

## 2015-10-29 MED ORDER — KETOROLAC TROMETHAMINE 30 MG/ML IJ SOLN
30.0000 mg | Freq: Once | INTRAMUSCULAR | Status: AC
  Administered 2015-10-29: 30 mg via INTRAMUSCULAR

## 2015-10-29 MED ORDER — CYCLOBENZAPRINE HCL 5 MG PO TABS: 5.0000 mg | ORAL_TABLET | Freq: Three times a day (TID) | ORAL | Status: AC | PRN

## 2015-10-29 NOTE — ED Notes (Signed)
Pt states left sided flank pain, denies any painful urination, states some nauea, states she started her period yesterday

## 2015-10-29 NOTE — ED Notes (Signed)
L flank pain began yesterday, increasing. Denies burning with urination

## 2015-10-29 NOTE — ED Provider Notes (Signed)
[facility] Emergency Department Provider Note  ____________________________________________   I have reviewed the triage vital signs and the nursing notes.   HISTORY  Chief Complaint Flank Pain    HPI Brittany [name] is a 39 y.o. female who is largely healthy, remote history of throat cancer [place] today complaining of left low back pain. Patient is on her menstrual period. She states she does have painful cramping. It sometimes goes to her back but this feels more severe. She states the pain is in her flank this time and ways it is not been before. She denies history of kidney stones. She has not had vomiting. The pain started fairly suddenly yesterday. No fever no chills. No dysuria no urinary frequency.Denies pregnancy and is in fact not pregnant. No incontinence of bowel or bladder or neurologic complaints  Past Medical History  Diagnosis Date  . Tonsil cancer [facility])     Patient Active Problem List   Diagnosis Date Noted  . Tonsil cancer ([place]) 09/13/2015    Past Surgical History  Procedure Laterality Date  . Tubal ligation      Current Outpatient Rx  Name  Route  Sig  Dispense  Refill  . gabapentin (NEURONTIN) 300 MG capsule               . Oxycodone HCl 10 MG TABS      TAKE 1 TABLET BY MOUTH EVERY 6 TO 8HOURS AND 1 AT BEDTIME AS NEEDED      0     Allergies Codeine  No family history on file.  Social History Social History  Substance Use Topics  . Smoking status: Former Research scientist (life sciences)  . Smokeless tobacco: Never Used  . Alcohol Use: No    Review of Systems Constitutional: No fever/chills Eyes: No visual changes. ENT: No sore throat. No stiff neck no neck pain Cardiovascular: Denies chest pain. Respiratory: Denies shortness of breath. Gastrointestinal:   no vomiting.  No diarrhea.  No constipation. Genitourinary: Negative for dysuria. Musculoskeletal: Negative lower extremity swelling Skin: Negative for rash. Neurological:  Negative for headaches, focal weakness or numbness. 10-point ROS otherwise negative.  ____________________________________________   PHYSICAL EXAM:  VITAL SIGNS: ED Triage Vitals  Enc Vitals Group     BP 10/29/15 1618 127/80 mmHg     Pulse Rate 10/29/15 1618 64     Resp 10/29/15 1618 18     Temp 10/29/15 1618 98.2 F (36.8 C)     Temp Source 10/29/15 1618 Oral     SpO2 10/29/15 1618 97 %     Weight 10/29/15 1618 180 lb (81.647 kg)     Height 10/29/15 1618 5\' 4"  (1.626 m)     Head Cir --      Peak Flow --      Pain Score 10/29/15 1620 9     Pain Loc --      Pain Edu? --      Excl. in [place]? --     Constitutional: Alert and oriented. Well appearing and in no acute distress. Eyes: Conjunctivae are normal. PERRL. EOMI. Head: Atraumatic. Nose: No congestion/rhinnorhea. Mouth/Throat: Mucous membranes are moist.  Oropharynx non-erythematous. Neck: No stridor.   Nontender with no meningismus Cardiovascular: Normal rate, regular rhythm. Grossly normal heart sounds.  Good peripheral circulation. Respiratory: Normal respiratory effort.  No retractions. Lungs CTAB. Abdominal: Soft and nontender. No distention. No guarding no rebound Back:  There is tenderness to palpation of the left CVA region but also in the left  lower paraspinal region of the lumbar back, there is no midline tenderness. Musculoskeletal: No lower extremity tenderness. No joint effusions, no DVT signs strong distal pulses no edema Neurologic:  Normal speech and language. No gross focal neurologic deficits are appreciated.  Skin:  Skin is warm, dry and intact. No rash noted. Psychiatric: Mood and affect are normal. Speech and behavior are normal.  ____________________________________________   LABS (all labs ordered are listed, but only abnormal results are displayed)  Labs Reviewed  URINALYSIS COMPLETEWITH MICROSCOPIC ([place]) - Abnormal; Notable for the following:    Color, Urine YELLOW (*)    APPearance  CLEAR (*)    Hgb urine dipstick 3+ (*)    Squamous Epithelial / LPF 0-5 (*)    All other components within normal limits  COMPREHENSIVE METABOLIC PANEL - Abnormal; Notable for the following:    Calcium 8.7 (*)    Total Bilirubin <0.1 (*)    Anion gap 4 (*)    All other components within normal limits  CBC  POC URINE PREG, ED  POCT PREGNANCY, URINE   ____________________________________________  EKG  I personally interpreted any EKGs ordered by me or triage  ____________________________________________  RADIOLOGY  I reviewed any imaging ordered by me or triage that were performed during my shift ____________________________________________   PROCEDURES  Procedure(s) performed: None  Critical Care performed: None  ____________________________________________   INITIAL IMPRESSION / ASSESSMENT AND PLAN / ED COURSE  Pertinent labs & imaging results that were available during my care of the patient were reviewed by me and considered in my medical decision making (see chart for details).  We did do a CT scan given question of flank pain with questionable hematuria, this is negative. Patient has a history of back pain and her pain is not specific to the flank and does incorporate the muscles of the back as well. So the muscle skeletal etiology is possible. Serial abdominal exams are completely benign, there is no evidence of radiating pain from abdominal origin. At this time, most likely musculoskeletal pain. We'll send a urine culture although no evidence of urinary tract infection clinically or otherwise. There is hematuria the patient is on her menstrual period and would prefer not to the cathetered. ____________________________________________   FINAL CLINICAL IMPRESSION(S) / ED DIAGNOSES  Final diagnoses:  Flank pain     Schuyler Amor, MD 10/29/15 1806

## 2015-10-31 LAB — URINE CULTURE

## 2015-12-20 DIAGNOSIS — Z79891 Long term (current) use of opiate analgesic: Secondary | ICD-10-CM | POA: Diagnosis not present

## 2015-12-20 DIAGNOSIS — G894 Chronic pain syndrome: Secondary | ICD-10-CM | POA: Diagnosis not present

## 2015-12-20 DIAGNOSIS — G893 Neoplasm related pain (acute) (chronic): Secondary | ICD-10-CM | POA: Diagnosis not present

## 2015-12-20 DIAGNOSIS — C140 Malignant neoplasm of pharynx, unspecified: Secondary | ICD-10-CM | POA: Diagnosis not present

## 2016-02-26 ENCOUNTER — Inpatient Hospital Stay: Payer: Medicare Other

## 2016-02-26 ENCOUNTER — Inpatient Hospital Stay: Payer: Medicare Other | Admitting: Oncology

## 2016-03-13 ENCOUNTER — Inpatient Hospital Stay: Payer: Medicare Other | Attending: Oncology

## 2016-03-13 ENCOUNTER — Inpatient Hospital Stay (HOSPITAL_BASED_OUTPATIENT_CLINIC_OR_DEPARTMENT_OTHER): Payer: Medicare Other | Admitting: Oncology

## 2016-03-13 VITALS — BP 116/77 | HR 50 | Temp 98.2°F | Resp 16 | Wt 177.2 lb

## 2016-03-13 DIAGNOSIS — Z87891 Personal history of nicotine dependence: Secondary | ICD-10-CM | POA: Insufficient documentation

## 2016-03-13 DIAGNOSIS — M2550 Pain in unspecified joint: Secondary | ICD-10-CM | POA: Insufficient documentation

## 2016-03-13 DIAGNOSIS — Z923 Personal history of irradiation: Secondary | ICD-10-CM | POA: Insufficient documentation

## 2016-03-13 DIAGNOSIS — Z79899 Other long term (current) drug therapy: Secondary | ICD-10-CM | POA: Diagnosis not present

## 2016-03-13 DIAGNOSIS — R1310 Dysphagia, unspecified: Secondary | ICD-10-CM | POA: Insufficient documentation

## 2016-03-13 DIAGNOSIS — R531 Weakness: Secondary | ICD-10-CM | POA: Diagnosis not present

## 2016-03-13 DIAGNOSIS — R5383 Other fatigue: Secondary | ICD-10-CM | POA: Insufficient documentation

## 2016-03-13 DIAGNOSIS — F419 Anxiety disorder, unspecified: Secondary | ICD-10-CM | POA: Insufficient documentation

## 2016-03-13 DIAGNOSIS — Z9221 Personal history of antineoplastic chemotherapy: Secondary | ICD-10-CM

## 2016-03-13 DIAGNOSIS — C099 Malignant neoplasm of tonsil, unspecified: Secondary | ICD-10-CM

## 2016-03-13 LAB — CBC WITH DIFFERENTIAL/PLATELET
BASOS ABS: 0 10*3/uL (ref 0–0.1)
BASOS PCT: 1 %
Eosinophils Absolute: 0.1 10*3/uL (ref 0–0.7)
Eosinophils Relative: 1 %
HEMATOCRIT: 37.5 % (ref 35.0–47.0)
HEMOGLOBIN: 12.8 g/dL (ref 12.0–16.0)
Lymphocytes Relative: 20 %
Lymphs Abs: 1.7 10*3/uL (ref 1.0–3.6)
MCH: 30.2 pg (ref 26.0–34.0)
MCHC: 34.2 g/dL (ref 32.0–36.0)
MCV: 88.3 fL (ref 80.0–100.0)
MONOS PCT: 5 %
Monocytes Absolute: 0.4 10*3/uL (ref 0.2–0.9)
NEUTROS ABS: 6.2 10*3/uL (ref 1.4–6.5)
NEUTROS PCT: 73 %
Platelets: 302 10*3/uL (ref 150–440)
RBC: 4.25 MIL/uL (ref 3.80–5.20)
RDW: 13.2 % (ref 11.5–14.5)
WBC: 8.4 10*3/uL (ref 3.6–11.0)

## 2016-03-13 LAB — BASIC METABOLIC PANEL
ANION GAP: 2 — AB (ref 5–15)
BUN: 11 mg/dL (ref 6–20)
CHLORIDE: 108 mmol/L (ref 101–111)
CO2: 27 mmol/L (ref 22–32)
Calcium: 8.6 mg/dL — ABNORMAL LOW (ref 8.9–10.3)
Creatinine, Ser: 0.71 mg/dL (ref 0.44–1.00)
GFR calc non Af Amer: >60 mL/min (ref >60)
Glucose, Bld: 100 mg/dL — ABNORMAL HIGH (ref 65–99)
POTASSIUM: 4.1 mmol/L (ref 3.5–5.1)
SODIUM: 137 mmol/L (ref 135–145)

## 2016-03-13 LAB — TSH: TSH: 6 u[IU]/mL — ABNORMAL HIGH (ref 0.350–4.500)

## 2016-03-13 NOTE — Progress Notes (Signed)
[place]  Telephone:(336) [phone] [phone]) [phone]  ID: Brittany [name] OB: 1976/07/25  MR#: [id number]  [id number]  Patient Care Team: Lorelee Market, MD as PCP - General (Family Medicine)  CHIEF COMPLAINT:  Chief Complaint  Patient presents with  . tonsil cancer    INTERVAL HISTORY: Patient returns to clinic today for routine 6 month evaluation.  She continues to have pain with swallowing that is unchanged from previous visits. She also complains of joint pain and fatigue today. She has a good appetite and denies any further weight loss.  She has no neurologic complaints. She denies any fevers. She denies any chest pain or shortness of breath.  She denies any nausea, vomiting, constipation, diarrhea.  She has no urinary complaints.  Patient offers no further specific complaints today.   REVIEW OF SYSTEMS:   Review of Systems  Constitutional: Positive for malaise/fatigue. Negative for fever and weight loss.  HENT:       Dysphasia.  Cardiovascular: Negative.   Gastrointestinal: Negative.   Musculoskeletal: Positive for joint pain.  Neurological: Positive for weakness.    As per HPI. Otherwise, a complete review of systems is negatve.  PAST MEDICAL HISTORY: Past Medical History  Diagnosis Date  . Tonsil cancer ([place])     PAST SURGICAL HISTORY: Past Surgical History  Procedure Laterality Date  . Tubal ligation      FAMILY HISTORY: Reviewed and unchanged. No reported history of malignancy or chronic disease.     ADVANCED DIRECTIVES:    HEALTH MAINTENANCE: Social History  Substance Use Topics  . Smoking status: Former Research scientist (life sciences)  . Smokeless tobacco: Never Used  . Alcohol Use: No    Allergies  Allergen Reactions  . Codeine Hives    Current Outpatient Prescriptions  Medication Sig Dispense Refill  . cyclobenzaprine (FLEXERIL) 5 MG tablet Take 1 tablet (5 mg total) by mouth every 8 (eight) hours as needed for muscle spasms. 30 tablet  1  . gabapentin (NEURONTIN) 300 MG capsule     . ibuprofen (MOTRIN IB) 200 MG tablet Take 3 tablets (600 mg total) by mouth every 6 (six) hours as needed. 20 tablet 0  . Oxycodone HCl 10 MG TABS TAKE 1 TABLET BY MOUTH EVERY 6 TO 8HOURS AND 1 AT BEDTIME AS NEEDED  0   No current facility-administered medications for this visit.    OBJECTIVE: Filed Vitals:   03/13/16 1033  BP: 116/77  Pulse: 50  Temp: 98.2 F (36.8 C)  Resp: 16     Body mass index is 30.41 kg/(m^2).    ECOG FS:0 - Asymptomatic  General: Well-developed, well-nourished, no acute distress. Eyes: Pink conjunctiva, anicteric sclera. HEENT: Normocephalic, moist mucous membranes, clear oropharnyx. No palpable lymphadenopathy. Lungs: Clear to auscultation bilaterally. Heart: Regular rate and rhythm. No rubs, murmurs, or gallops. Abdomen: Soft, nontender, nondistended. No organomegaly noted, normoactive bowel sounds. Musculoskeletal: No edema, cyanosis, or clubbing. Neuro: Alert, answering all questions appropriately. Cranial nerves grossly intact. Skin: No rashes or petechiae noted. Psych: Normal affect.   LAB RESULTS:  Lab Results  Component Value Date   NA 137 03/13/2016   K 4.1 03/13/2016   CL 108 03/13/2016   CO2 27 03/13/2016   GLUCOSE 100* 03/13/2016   BUN 11 03/13/2016   CREATININE 0.71 03/13/2016   CALCIUM 8.6* 03/13/2016   PROT 6.6 10/29/2015   ALBUMIN 3.8 10/29/2015   AST 16 10/29/2015   ALT 14 10/29/2015   ALKPHOS 48 10/29/2015   BILITOT <0.1* 10/29/2015  GFRNONAA >60 03/13/2016   GFRAA >60 03/13/2016    Lab Results  Component Value Date   WBC 8.4 03/13/2016   NEUTROABS 6.2 03/13/2016   HGB 12.8 03/13/2016   HCT 37.5 03/13/2016   MCV 88.3 03/13/2016   PLT 302 03/13/2016     STUDIES: No results found.  ASSESSMENT: Stage IVa tonsillar squamous cell carcinoma  PLAN:    1.  Head and neck cancer: Patient completed concurrent chemotherapy with cisplatin and XRT in March 2014.  CT  results completed on January 01, 2015 were essentially unchanged.  No further imaging is necessary at this time unless there is suspicion of recurrence. Patient has not been evaluated by ENT in several years despite recommendations, will send referral to ENT today . Thyroid studies are pending for today. Return to clinic in 6 months for routine evaluation. 2.  Pain: Continue current treatment per pain clinic. 3.  Anxiety: Continue Xanax as needed.   Patient expressed understanding and was in agreement with this plan. She also understands that She can call clinic at any time with any questions, concerns, or complaints.   Tonsil cancer [facility])   Staging form: Pharynx - Oropharynx, AJCC 7th Edition     Clinical stage from 09/13/2015: Stage IVA (T4a, N2, M0) - Signed by Lloyd Huger, MD on 09/13/2015   Mayra Reel, NP   03/13/2016 11:06 AM  Patient was seen and evaluated independently and I agree with the assessment and plan as dictated above.  Lloyd Huger, MD 03/16/2016 8:13 PM

## 2016-03-13 NOTE — Progress Notes (Signed)
Patient has been feeling very fatigued and has achy joints.  The pain in throat has not seemed to improved and is being treated at pain clinic.

## 2016-03-14 LAB — T4: T4 TOTAL: 6.6 ug/dL (ref 4.5–12.0)

## 2016-04-10 DIAGNOSIS — G893 Neoplasm related pain (acute) (chronic): Secondary | ICD-10-CM | POA: Diagnosis not present

## 2016-04-10 DIAGNOSIS — Z79891 Long term (current) use of opiate analgesic: Secondary | ICD-10-CM | POA: Diagnosis not present

## 2016-04-10 DIAGNOSIS — C140 Malignant neoplasm of pharynx, unspecified: Secondary | ICD-10-CM | POA: Diagnosis not present

## 2016-04-10 DIAGNOSIS — Z5181 Encounter for therapeutic drug level monitoring: Secondary | ICD-10-CM | POA: Diagnosis not present

## 2016-07-02 DIAGNOSIS — C140 Malignant neoplasm of pharynx, unspecified: Secondary | ICD-10-CM | POA: Diagnosis not present

## 2016-07-02 DIAGNOSIS — Z5181 Encounter for therapeutic drug level monitoring: Secondary | ICD-10-CM | POA: Diagnosis not present

## 2016-07-02 DIAGNOSIS — G893 Neoplasm related pain (acute) (chronic): Secondary | ICD-10-CM | POA: Diagnosis not present

## 2016-07-02 DIAGNOSIS — Z79891 Long term (current) use of opiate analgesic: Secondary | ICD-10-CM | POA: Diagnosis not present

## 2016-09-03 DIAGNOSIS — G893 Neoplasm related pain (acute) (chronic): Secondary | ICD-10-CM | POA: Diagnosis not present

## 2016-09-03 DIAGNOSIS — G894 Chronic pain syndrome: Secondary | ICD-10-CM | POA: Diagnosis not present

## 2016-09-03 DIAGNOSIS — C140 Malignant neoplasm of pharynx, unspecified: Secondary | ICD-10-CM | POA: Diagnosis not present

## 2016-09-03 DIAGNOSIS — Z79891 Long term (current) use of opiate analgesic: Secondary | ICD-10-CM | POA: Diagnosis not present

## 2016-09-03 DIAGNOSIS — Z5181 Encounter for therapeutic drug level monitoring: Secondary | ICD-10-CM | POA: Diagnosis not present

## 2016-09-10 NOTE — Progress Notes (Deleted)
[place]  Telephone:(336) [phone] [phone]) [phone]  ID: Allene Dillon OB: [birth year]  MR#: [id number]  [id number]  Patient Care Team: Lorelee Market, MD as PCP - General (Family Medicine)  CHIEF COMPLAINT:  No chief complaint on file.   INTERVAL HISTORY: Patient returns to clinic today for routine 6 month evaluation.  She continues to have pain with swallowing that is unchanged from previous visits. She also complains of joint pain and fatigue today. She has a good appetite and denies any further weight loss.  She has no neurologic complaints. She denies any fevers. She denies any chest pain or shortness of breath.  She denies any nausea, vomiting, constipation, diarrhea.  She has no urinary complaints.  Patient offers no further specific complaints today.   REVIEW OF SYSTEMS:   Review of Systems  Constitutional: Positive for malaise/fatigue. Negative for fever and weight loss.  HENT:       Dysphasia.  Cardiovascular: Negative.   Gastrointestinal: Negative.   Musculoskeletal: Positive for joint pain.  Neurological: Positive for weakness.    As per HPI. Otherwise, a complete review of systems is negatve.  PAST MEDICAL HISTORY: Past Medical History:  Diagnosis Date  . Tonsil cancer ([place])     PAST SURGICAL HISTORY: Past Surgical History:  Procedure Laterality Date  . TUBAL LIGATION      FAMILY HISTORY: Reviewed and unchanged. No reported history of malignancy or chronic disease.     ADVANCED DIRECTIVES:    HEALTH MAINTENANCE: Social History  Substance Use Topics  . Smoking status: Former Research scientist (life sciences)  . Smokeless tobacco: Never Used  . Alcohol use No    Allergies  Allergen Reactions  . Codeine Hives    Current Outpatient Prescriptions  Medication Sig Dispense Refill  . cyclobenzaprine (FLEXERIL) 5 MG tablet Take 1 tablet (5 mg total) by mouth every 8 (eight) hours as needed for muscle spasms. 30 tablet 1  . gabapentin (NEURONTIN)  300 MG capsule     . ibuprofen (MOTRIN IB) 200 MG tablet Take 3 tablets (600 mg total) by mouth every 6 (six) hours as needed. 20 tablet 0  . Oxycodone HCl 10 MG TABS TAKE 1 TABLET BY MOUTH EVERY 6 TO 8HOURS AND 1 AT BEDTIME AS NEEDED  0   No current facility-administered medications for this visit.     OBJECTIVE: There were no vitals filed for this visit.   There is no height or weight on file to calculate BMI.    ECOG FS:0 - Asymptomatic  General: Well-developed, well-nourished, no acute distress. Eyes: Pink conjunctiva, anicteric sclera. HEENT: Normocephalic, moist mucous membranes, clear oropharnyx. No palpable lymphadenopathy. Lungs: Clear to auscultation bilaterally. Heart: Regular rate and rhythm. No rubs, murmurs, or gallops. Abdomen: Soft, nontender, nondistended. No organomegaly noted, normoactive bowel sounds. Musculoskeletal: No edema, cyanosis, or clubbing. Neuro: Alert, answering all questions appropriately. Cranial nerves grossly intact. Skin: No rashes or petechiae noted. Psych: Normal affect.   LAB RESULTS:  Lab Results  Component Value Date   NA 137 03/13/2016   K 4.1 03/13/2016   CL 108 03/13/2016   CO2 27 03/13/2016   GLUCOSE 100 (H) 03/13/2016   BUN 11 03/13/2016   CREATININE 0.71 03/13/2016   CALCIUM 8.6 (L) 03/13/2016   PROT 6.6 10/29/2015   ALBUMIN 3.8 10/29/2015   AST 16 10/29/2015   ALT 14 10/29/2015   ALKPHOS 48 10/29/2015   BILITOT <0.1 (L) 10/29/2015   GFRNONAA >60 03/13/2016   GFRAA >60 03/13/2016  Lab Results  Component Value Date   WBC 8.4 03/13/2016   NEUTROABS 6.2 03/13/2016   HGB 12.8 03/13/2016   HCT 37.5 03/13/2016   MCV 88.3 03/13/2016   PLT 302 03/13/2016     STUDIES: No results found.  ASSESSMENT: Stage IVa tonsillar squamous cell carcinoma  PLAN:    1.  Head and neck cancer: Patient completed concurrent chemotherapy with cisplatin and XRT in March 2014.  CT results completed on January 01, 2015 were essentially  unchanged.  No further imaging is necessary at this time unless there is suspicion of recurrence. Patient has not been evaluated by ENT in several years despite recommendations, will send referral to ENT today . Thyroid studies are pending for today. Return to clinic in 6 months for routine evaluation. 2.  Pain: Continue current treatment per pain clinic. 3.  Anxiety: Continue Xanax as needed.   Patient expressed understanding and was in agreement with this plan. She also understands that She can call clinic at any time with any questions, concerns, or complaints.   Tonsil cancer [facility])   Staging form: Pharynx - Oropharynx, AJCC 7th Edition     Clinical stage from 09/13/2015: Stage IVA (T4a, N2, M0) - Signed by Lloyd Huger, MD on 09/13/2015   Lloyd Huger, MD   09/10/2016 10:46 PM  Patient was seen and evaluated independently and I agree with the assessment and plan as dictated above.  Lloyd Huger, MD 09/10/16 10:46 PM

## 2016-09-11 ENCOUNTER — Inpatient Hospital Stay: Payer: Medicare Other | Admitting: Oncology

## 2016-09-11 ENCOUNTER — Inpatient Hospital Stay: Payer: Medicare Other

## 2016-09-24 NOTE — Progress Notes (Deleted)
[place]  Telephone:(336) [phone] Fax:(336) [phone]  ID: Brittany [name] OB: 20-Jul-1976  MR#: [id number]  [id number]  Patient Care Team: Lorelee Market, MD as PCP - General (Family Medicine)  CHIEF COMPLAINT: Stage IVa tonsillar squamous cell carcinoma  INTERVAL HISTORY: Patient returns to clinic today for routine 6 month evaluation.  She continues to have pain with swallowing that is unchanged from previous visits. She also complains of joint pain and fatigue today. She has a good appetite and denies any further weight loss.  She has no neurologic complaints. She denies any fevers. She denies any chest pain or shortness of breath.  She denies any nausea, vomiting, constipation, diarrhea.  She has no urinary complaints.  Patient offers no further specific complaints today.   REVIEW OF SYSTEMS:   Review of Systems  Constitutional: Positive for malaise/fatigue. Negative for fever and weight loss.  HENT:       Dysphasia.  Cardiovascular: Negative.   Gastrointestinal: Negative.   Musculoskeletal: Positive for joint pain.  Neurological: Positive for weakness.    As per HPI. Otherwise, a complete review of systems is negatve.  PAST MEDICAL HISTORY: Past Medical History:  Diagnosis Date  . Tonsil cancer ([place])     PAST SURGICAL HISTORY: Past Surgical History:  Procedure Laterality Date  . TUBAL LIGATION      FAMILY HISTORY: Reviewed and unchanged. No reported history of malignancy or chronic disease.     ADVANCED DIRECTIVES:    HEALTH MAINTENANCE: Social History  Substance Use Topics  . Smoking status: Former Research scientist (life sciences)  . Smokeless tobacco: Never Used  . Alcohol use No    Allergies  Allergen Reactions  . Codeine Hives    Current Outpatient Prescriptions  Medication Sig Dispense Refill  . cyclobenzaprine (FLEXERIL) 5 MG tablet Take 1 tablet (5 mg total) by mouth every 8 (eight) hours as needed for muscle spasms. 30 tablet 1  . gabapentin  (NEURONTIN) 300 MG capsule     . ibuprofen (MOTRIN IB) 200 MG tablet Take 3 tablets (600 mg total) by mouth every 6 (six) hours as needed. 20 tablet 0  . Oxycodone HCl 10 MG TABS TAKE 1 TABLET BY MOUTH EVERY 6 TO 8HOURS AND 1 AT BEDTIME AS NEEDED  0   No current facility-administered medications for this visit.     OBJECTIVE: There were no vitals filed for this visit.   There is no height or weight on file to calculate BMI.    ECOG FS:0 - Asymptomatic  General: Well-developed, well-nourished, no acute distress. Eyes: Pink conjunctiva, anicteric sclera. HEENT: Normocephalic, moist mucous membranes, clear oropharnyx. No palpable lymphadenopathy. Lungs: Clear to auscultation bilaterally. Heart: Regular rate and rhythm. No rubs, murmurs, or gallops. Abdomen: Soft, nontender, nondistended. No organomegaly noted, normoactive bowel sounds. Musculoskeletal: No edema, cyanosis, or clubbing. Neuro: Alert, answering all questions appropriately. Cranial nerves grossly intact. Skin: No rashes or petechiae noted. Psych: Normal affect.   LAB RESULTS:  Lab Results  Component Value Date   NA 137 03/13/2016   K 4.1 03/13/2016   CL 108 03/13/2016   CO2 27 03/13/2016   GLUCOSE 100 (H) 03/13/2016   BUN 11 03/13/2016   CREATININE 0.71 03/13/2016   CALCIUM 8.6 (L) 03/13/2016   PROT 6.6 10/29/2015   ALBUMIN 3.8 10/29/2015   AST 16 10/29/2015   ALT 14 10/29/2015   ALKPHOS 48 10/29/2015   BILITOT <0.1 (L) 10/29/2015   GFRNONAA >60 03/13/2016   GFRAA >60 03/13/2016    Lab  Results  Component Value Date   WBC 8.4 03/13/2016   NEUTROABS 6.2 03/13/2016   HGB 12.8 03/13/2016   HCT 37.5 03/13/2016   MCV 88.3 03/13/2016   PLT 302 03/13/2016     STUDIES: No results found.  ASSESSMENT: Stage IVa tonsillar squamous cell carcinoma  PLAN:    1.  Stage IVa tonsillar squamous cell carcinoma: Patient completed concurrent chemotherapy with cisplatin and XRT in March 2014.  CT results completed on  January 01, 2015 were essentially unchanged.  No further imaging is necessary at this time unless there is suspicion of recurrence. Patient has not been evaluated by ENT in several years despite recommendations, will send referral to ENT today . Thyroid studies are pending for today. Return to clinic in 6 months for routine evaluation. 2.  Pain: Continue current treatment per pain clinic. 3.  Anxiety: Continue Xanax as needed.   Patient expressed understanding and was in agreement with this plan. She also understands that She can call clinic at any time with any questions, concerns, or complaints.   Tonsil cancer [facility])   Staging form: Pharynx - Oropharynx, AJCC 7th Edition     Clinical stage from 09/13/2015: Stage IVA (T4a, N2, M0) - Signed by Lloyd Huger, MD on 09/13/2015   Lloyd Huger, MD   09/24/2016 5:00 PM  Patient was seen and evaluated independently and I agree with the assessment and plan as dictated above.  Lloyd Huger, MD 09/24/16 5:00 PM

## 2016-09-25 ENCOUNTER — Inpatient Hospital Stay: Payer: Medicare Other | Admitting: Oncology

## 2016-09-25 ENCOUNTER — Inpatient Hospital Stay: Payer: Medicare Other

## 2016-12-29 IMAGING — CT CT RENAL STONE PROTOCOL
1 of 2 series · 15 of 32 positions shown, 19 images · non-contrast
Comparison: 02/05/2013

CLINICAL DATA: Left flank pain beginning yesterday.

EXAM:
CT ABDOMEN AND PELVIS WITHOUT CONTRAST
TECHNIQUE: Multidetector CT imaging of the abdomen and pelvis was performed
following the standard protocol without IV contrast.

[Series 2: stone standard full · axial · 0.86mm/px · z∈[-1225,-830]mm · 15 of 87 slices shown, 19 images]
[im 4/87  soft-tissue]
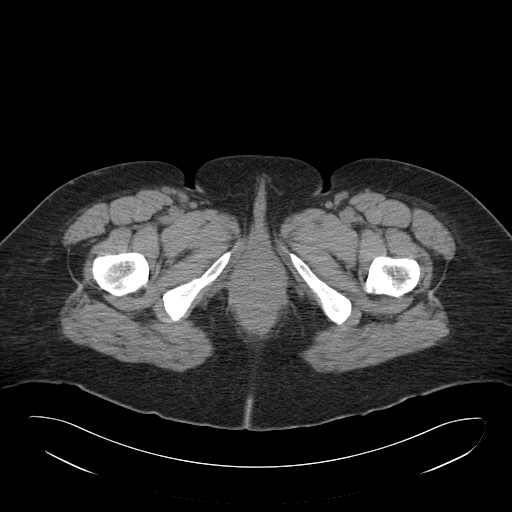
[im 4/87  bone]
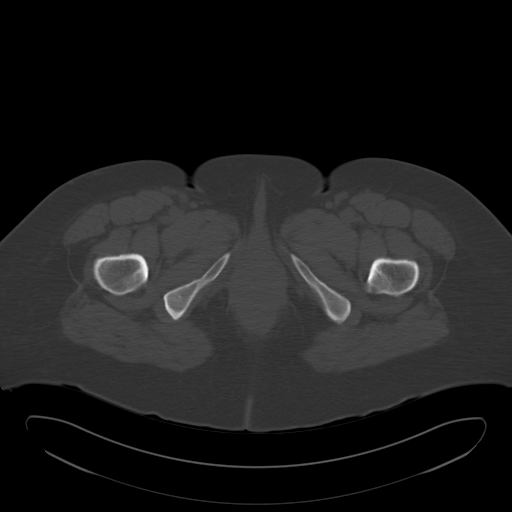
[im 11/87  soft-tissue]
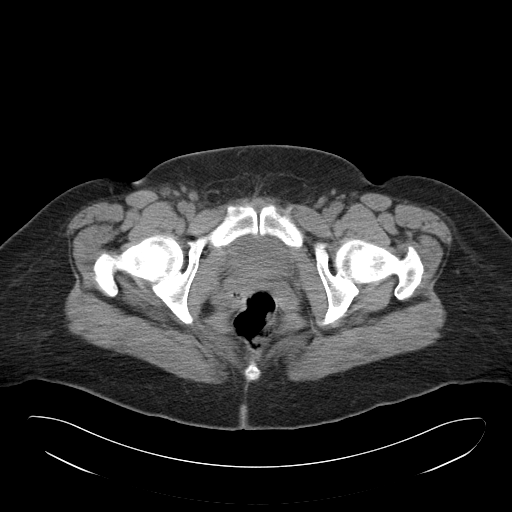
[im 18/87  soft-tissue]
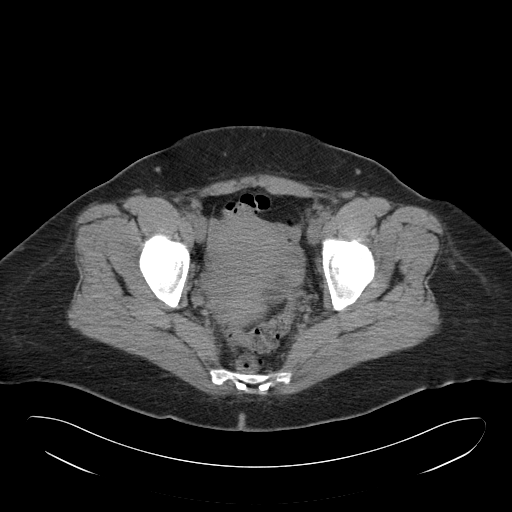
[im 26/87  soft-tissue]
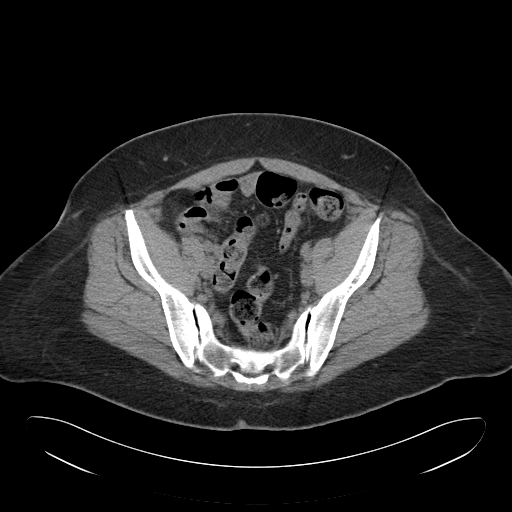
[im 29/87  soft-tissue]
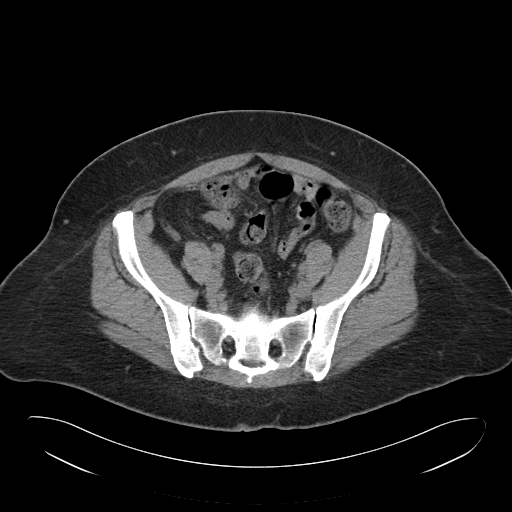
[im 36/87  soft-tissue]
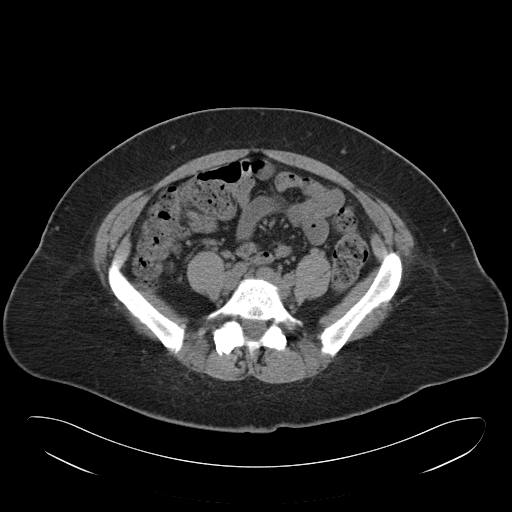
[im 44/87  soft-tissue]
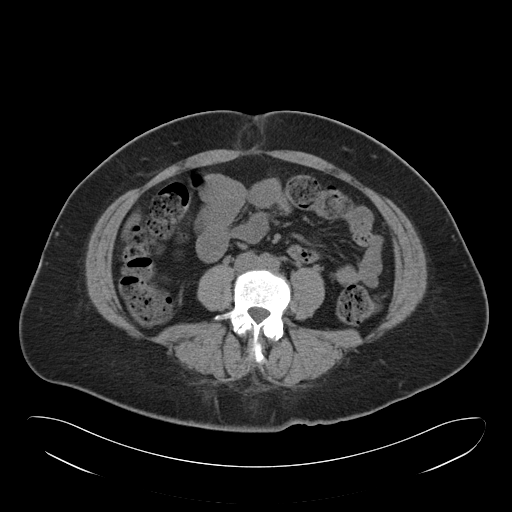
[im 51/87  soft-tissue]
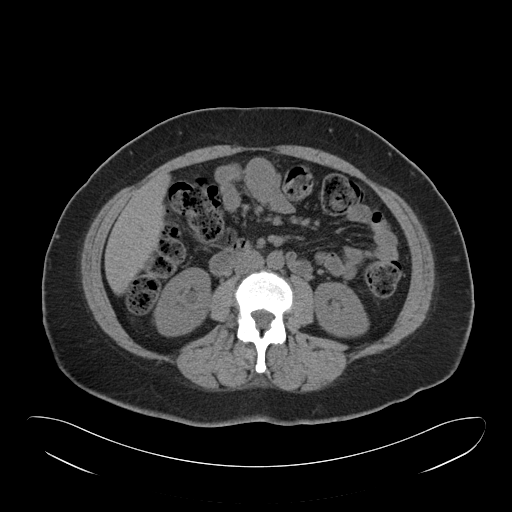
[im 58/87  soft-tissue]
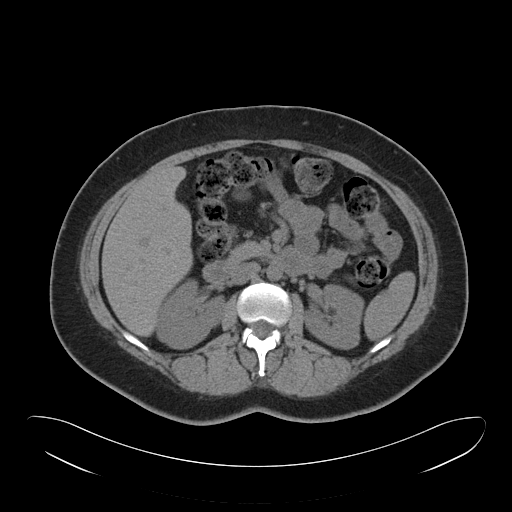
[im 58/87  bone]
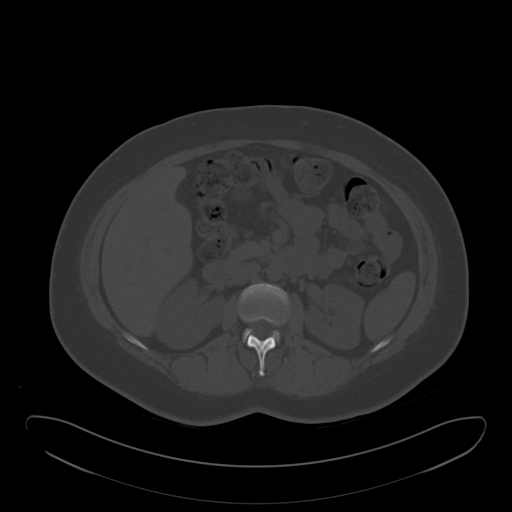
[im 61/87  soft-tissue]
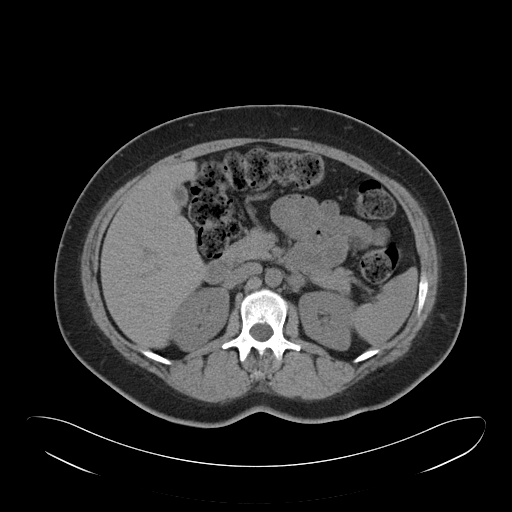
[im 69/87  soft-tissue]
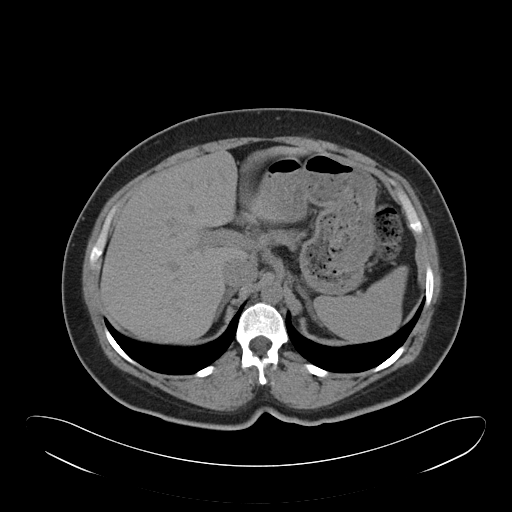
[im 72/87  lung]
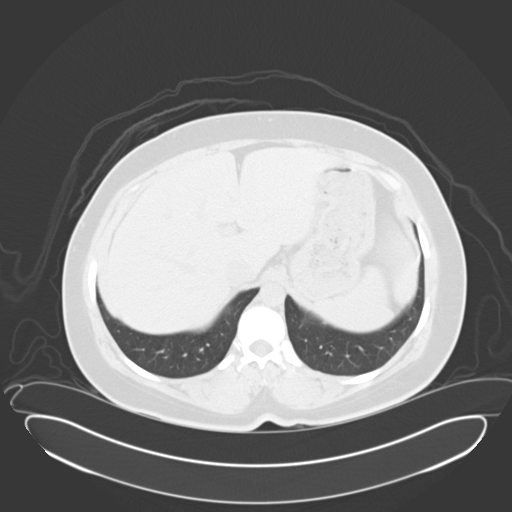
[im 76/87  soft-tissue]
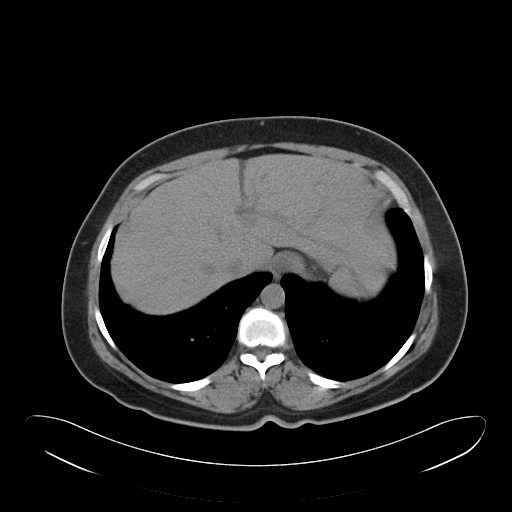
[im 76/87  lung]
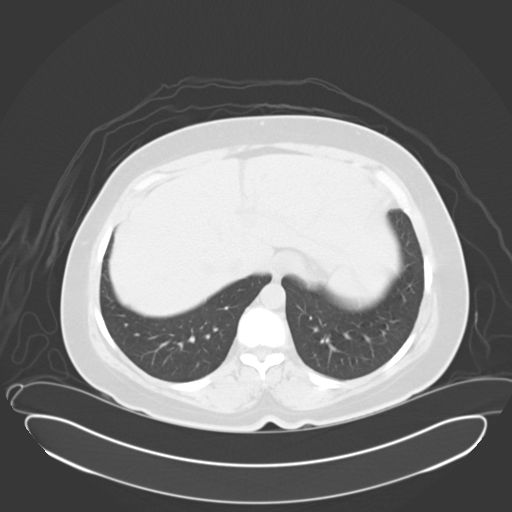
[im 79/87  lung]
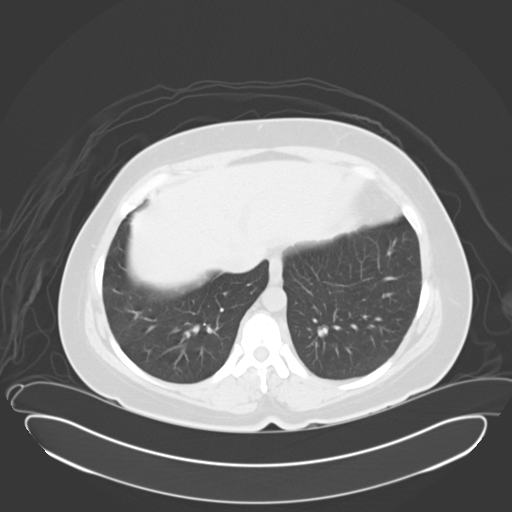
[im 83/87  soft-tissue]
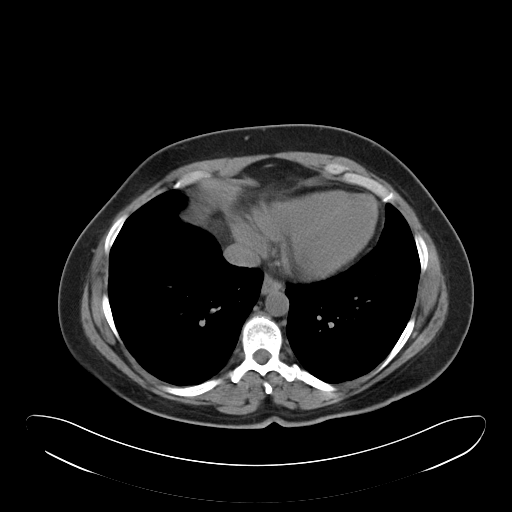
[im 83/87  lung]
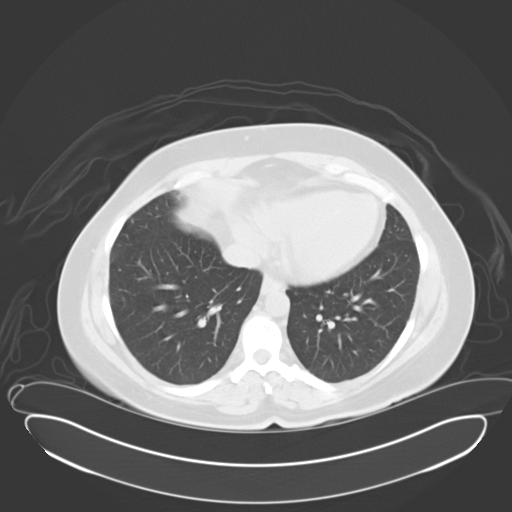

[15 of 32 positions shown; findings below may reference images not displayed]

FINDINGS: Lower chest:  No acute findings.

Hepatobiliary: No mass visualized on this un-enhanced exam.
Gallbladder is unremarkable.

Pancreas: No mass or inflammatory process identified on this
un-enhanced exam.

Spleen: Within normal limits in size.

Adrenals/Urinary Tract: No evidence of urolithiasis or
hydronephrosis. No definite mass visualized on this un-enhanced
exam.

Stomach/Bowel: No evidence of obstruction, inflammatory process, or
abnormal fluid collections. Large colonic stool burden noted.

Vascular/Lymphatic: No pathologically enlarged lymph nodes. No
evidence of abdominal aortic aneurysm.

Reproductive: No mass or other significant abnormality.

Other: A small paraumbilical ventral hernias again seen containing
only fat. No evidence herniated bowel loops.

Musculoskeletal:  No suspicious bone lesions identified.
IMPRESSION: No evidence of urolithiasis, hydronephrosis, or other acute
findings.

Stable small periumbilical ventral hernia containing only fat.

Large stool burden noted; suggest clinical correlation for possible
constipation.

## 2016-12-31 DIAGNOSIS — Z79891 Long term (current) use of opiate analgesic: Secondary | ICD-10-CM | POA: Diagnosis not present

## 2016-12-31 DIAGNOSIS — C140 Malignant neoplasm of pharynx, unspecified: Secondary | ICD-10-CM | POA: Diagnosis not present

## 2016-12-31 DIAGNOSIS — Z5181 Encounter for therapeutic drug level monitoring: Secondary | ICD-10-CM | POA: Diagnosis not present

## 2016-12-31 DIAGNOSIS — G893 Neoplasm related pain (acute) (chronic): Secondary | ICD-10-CM | POA: Diagnosis not present

## 2017-04-03 DIAGNOSIS — C140 Malignant neoplasm of pharynx, unspecified: Secondary | ICD-10-CM | POA: Diagnosis not present

## 2017-04-03 DIAGNOSIS — G893 Neoplasm related pain (acute) (chronic): Secondary | ICD-10-CM | POA: Diagnosis not present

## 2017-04-03 DIAGNOSIS — Z5181 Encounter for therapeutic drug level monitoring: Secondary | ICD-10-CM | POA: Diagnosis not present

## 2017-04-03 DIAGNOSIS — Z79891 Long term (current) use of opiate analgesic: Secondary | ICD-10-CM | POA: Diagnosis not present

## 2017-04-03 DIAGNOSIS — G894 Chronic pain syndrome: Secondary | ICD-10-CM | POA: Diagnosis not present

## 2017-07-10 DIAGNOSIS — Z79891 Long term (current) use of opiate analgesic: Secondary | ICD-10-CM | POA: Diagnosis not present

## 2017-07-10 DIAGNOSIS — Z5181 Encounter for therapeutic drug level monitoring: Secondary | ICD-10-CM | POA: Diagnosis not present

## 2017-07-10 DIAGNOSIS — G893 Neoplasm related pain (acute) (chronic): Secondary | ICD-10-CM | POA: Diagnosis not present

## 2017-07-10 DIAGNOSIS — C140 Malignant neoplasm of pharynx, unspecified: Secondary | ICD-10-CM | POA: Diagnosis not present

## 2017-09-22 DIAGNOSIS — C140 Malignant neoplasm of pharynx, unspecified: Secondary | ICD-10-CM | POA: Diagnosis not present

## 2017-09-22 DIAGNOSIS — G894 Chronic pain syndrome: Secondary | ICD-10-CM | POA: Diagnosis not present

## 2017-09-22 DIAGNOSIS — Z5181 Encounter for therapeutic drug level monitoring: Secondary | ICD-10-CM | POA: Diagnosis not present

## 2017-09-22 DIAGNOSIS — G893 Neoplasm related pain (acute) (chronic): Secondary | ICD-10-CM | POA: Diagnosis not present

## 2017-09-22 DIAGNOSIS — Z79891 Long term (current) use of opiate analgesic: Secondary | ICD-10-CM | POA: Diagnosis not present

## 2017-12-18 DIAGNOSIS — G893 Neoplasm related pain (acute) (chronic): Secondary | ICD-10-CM | POA: Diagnosis not present

## 2017-12-18 DIAGNOSIS — Z5181 Encounter for therapeutic drug level monitoring: Secondary | ICD-10-CM | POA: Diagnosis not present

## 2017-12-18 DIAGNOSIS — C140 Malignant neoplasm of pharynx, unspecified: Secondary | ICD-10-CM | POA: Diagnosis not present

## 2017-12-18 DIAGNOSIS — Z79891 Long term (current) use of opiate analgesic: Secondary | ICD-10-CM | POA: Diagnosis not present

## 2018-03-18 DIAGNOSIS — Z79891 Long term (current) use of opiate analgesic: Secondary | ICD-10-CM | POA: Diagnosis not present

## 2018-03-18 DIAGNOSIS — G893 Neoplasm related pain (acute) (chronic): Secondary | ICD-10-CM | POA: Diagnosis not present

## 2018-03-18 DIAGNOSIS — C140 Malignant neoplasm of pharynx, unspecified: Secondary | ICD-10-CM | POA: Diagnosis not present

## 2018-03-18 DIAGNOSIS — Z5181 Encounter for therapeutic drug level monitoring: Secondary | ICD-10-CM | POA: Diagnosis not present

## 2018-03-18 DIAGNOSIS — G894 Chronic pain syndrome: Secondary | ICD-10-CM | POA: Diagnosis not present

## 2018-06-18 DIAGNOSIS — Z5181 Encounter for therapeutic drug level monitoring: Secondary | ICD-10-CM | POA: Diagnosis not present

## 2018-06-18 DIAGNOSIS — G893 Neoplasm related pain (acute) (chronic): Secondary | ICD-10-CM | POA: Diagnosis not present

## 2018-06-18 DIAGNOSIS — C140 Malignant neoplasm of pharynx, unspecified: Secondary | ICD-10-CM | POA: Diagnosis not present

## 2018-06-18 DIAGNOSIS — Z79891 Long term (current) use of opiate analgesic: Secondary | ICD-10-CM | POA: Diagnosis not present

## 2018-08-13 DIAGNOSIS — Z79891 Long term (current) use of opiate analgesic: Secondary | ICD-10-CM | POA: Diagnosis not present

## 2018-08-13 DIAGNOSIS — G894 Chronic pain syndrome: Secondary | ICD-10-CM | POA: Diagnosis not present

## 2018-08-13 DIAGNOSIS — Z5181 Encounter for therapeutic drug level monitoring: Secondary | ICD-10-CM | POA: Diagnosis not present

## 2018-08-13 DIAGNOSIS — G893 Neoplasm related pain (acute) (chronic): Secondary | ICD-10-CM | POA: Diagnosis not present

## 2018-08-13 DIAGNOSIS — C140 Malignant neoplasm of pharynx, unspecified: Secondary | ICD-10-CM | POA: Diagnosis not present

## 2018-11-05 DIAGNOSIS — C140 Malignant neoplasm of pharynx, unspecified: Secondary | ICD-10-CM | POA: Diagnosis not present

## 2018-11-05 DIAGNOSIS — G894 Chronic pain syndrome: Secondary | ICD-10-CM | POA: Diagnosis not present

## 2018-11-05 DIAGNOSIS — G893 Neoplasm related pain (acute) (chronic): Secondary | ICD-10-CM | POA: Diagnosis not present

## 2018-11-05 DIAGNOSIS — Z79891 Long term (current) use of opiate analgesic: Secondary | ICD-10-CM | POA: Diagnosis not present
